# Patient Record
Sex: Male | Born: 1975 | Hispanic: Yes | Marital: Married | Smoking: Current every day smoker
Health system: Southern US, Community
[De-identification: ages and names within clinical notes are randomized; demographics above are authoritative.]

## PROBLEM LIST (undated history)

## (undated) DIAGNOSIS — J449 Chronic obstructive pulmonary disease, unspecified: Secondary | ICD-10-CM

## (undated) DIAGNOSIS — F17200 Nicotine dependence, unspecified, uncomplicated: Secondary | ICD-10-CM

## (undated) DIAGNOSIS — E119 Type 2 diabetes mellitus without complications: Secondary | ICD-10-CM

## (undated) DIAGNOSIS — K219 Gastro-esophageal reflux disease without esophagitis: Secondary | ICD-10-CM

## (undated) DIAGNOSIS — E785 Hyperlipidemia, unspecified: Secondary | ICD-10-CM

## (undated) HISTORY — DX: Nicotine dependence, unspecified, uncomplicated: F17.200

## (undated) HISTORY — DX: Gastro-esophageal reflux disease without esophagitis: K21.9

## (undated) HISTORY — DX: Chronic obstructive pulmonary disease, unspecified: J44.9

## (undated) HISTORY — DX: Type 2 diabetes mellitus without complications: E11.9

## (undated) HISTORY — DX: Hyperlipidemia, unspecified: E78.5

---

## 2014-11-23 ENCOUNTER — Ambulatory Visit (INDEPENDENT_AMBULATORY_CARE_PROVIDER_SITE_OTHER): Payer: Self-pay | Admitting: Family Medicine

## 2014-11-23 VITALS — BP 126/78 | HR 78 | Temp 98.3°F | Resp 16 | Ht 69.0 in | Wt 200.4 lb

## 2014-11-23 DIAGNOSIS — B029 Zoster without complications: Secondary | ICD-10-CM

## 2014-11-23 MED ORDER — HYDROCODONE-ACETAMINOPHEN 5-325 MG PO TABS: 1.0000 | ORAL_TABLET | Freq: Four times a day (QID) | ORAL | Status: DC | PRN

## 2014-11-23 NOTE — Progress Notes (Signed)
Patient ID: Matthew Hoover, male   DOB: 03-Apr-1976, 39 y.o.   MRN: [id number]   This chart was scribed for Elvina Sidle, MD by [facility], medical scribe at [facility].The patient was seen in exam room 03 and the patient's care was started at 11:07 AM.  Patient ID: Matthew Hoover MRN: [id number], DOB: 1975/07/12, 39 y.o. Date of Encounter: 11/23/2014  Primary Physician: No primary care provider on file.  Chief Complaint:  Chief Complaint  Patient presents with  . Back Pain    x 1 week  . Rash    x 3 days, located on back and going down left leg   HPI:  Matthew Hoover is a 39 y.o. male who presents to [facility] complaining of rash starting at his left lower back radiating down through his left buttock to he left upper leg. He initially developed a pain for two days then the rash appeared, which has been present for three days. When the pain first started he was unable to sleep but he has gradually been able to sleep.  History reviewed. No pertinent past medical history.   Home Meds: Prior to Admission medications   Not on File   Allergies: No Known Allergies  History   Social History  . Marital Status: Married    Spouse Name: N/A  . Number of Children: N/A  . Years of Education: N/A   Occupational History  . Not on file.   Social History Main Topics  . Smoking status: Current Every Day Smoker  . Smokeless tobacco: Not on file  . Alcohol Use: Not on file  . Drug Use: Not on file  . Sexual Activity: Not on file   Other Topics Concern  . Not on file   Social History Narrative  . No narrative on file   Review of Systems: Constitutional: negative for chills, fever, night sweats, weight changes, or fatigue  HEENT: negative for vision changes, hearing loss, congestion, rhinorrhea, ST, epistaxis, or sinus pressure Cardiovascular: negative for chest pain or palpitations Respiratory: negative for hemoptysis, wheezing,  shortness of breath, or cough Abdominal: negative for abdominal pain, nausea, vomiting, diarrhea, or constipation Dermatological:positive for rash Msk: positive for back pain Neurologic: negative for headache, dizziness, or syncope All other systems reviewed and are otherwise negative with the exception to those above and in the HPI.  Physical Exam: Blood pressure 126/78, pulse 78, temperature 98.3 F (36.8 C), temperature source Oral, resp. rate 16, height 5\' 9"  (1.753 m), weight 200 lb 6.4 oz (90.901 kg), SpO2 98 %., Body mass index is 29.58 kg/(m^2). General: Well developed, well nourished, in no acute distress. Head: Normocephalic, atraumatic, eyes without discharge, sclera non-icteric, nares are without discharge. Bilateral auditory canals clear, TM's are without perforation, pearly grey and translucent with reflective cone of light bilaterally. Oral cavity moist, posterior pharynx without exudate, erythema, peritonsillar abscess, or post nasal drip.  Neck: Supple. No thyromegaly. Full ROM. No lymphadenopathy. Lungs: Clear bilaterally to auscultation without wheezes, rales, or rhonchi. Breathing is unlabored. Heart: RRR with S1 S2. No murmurs, rubs, or gallops appreciated. Abdomen: Soft, non-tender, non-distended with normoactive bowel sounds. No hepatomegaly. No rebound/guarding. No obvious abdominal masses. Msk:  Strength and tone normal for age. Extremities/Skin: Warm and dry. No clubbing or cyanosis. No edema. Vascular rash starting at his left lower back that radiates down his left buttock to the lateral thigh. Neuro: Alert and oriented X 3. Moves all extremities spontaneousy. Gait is  normal. CNII-XII grossly in tact. Psych:  Responds to questions appropriately with a normal affect.    ASSESSMENT AND PLAN:  39 y.o. year old male with shingles  This chart was scribed in my presence and reviewed by me personally.    ICD-9-CM ICD-10-CM   1. Shingles 053.9 B02.9  HYDROcodone-acetaminophen (NORCO) 5-325 MG per tablet    Signed, Elvina Sidle, MD 11/23/2014 11:07 AM

## 2014-11-23 NOTE — Patient Instructions (Signed)
Culebrilla (Shingles) La culebrilla (herpes zoster) es una infeccin causada por el mismo virus que causa la varicela. La infeccin causa una erupcin y ampollas dolorosas llenas de lquido en la piel, las cuales se abren, forman costra y luego se curan. Puede ocurrir en cualquier parte del cuerpo, pero por lo general afecta solo un lado del cuerpo o del rostro. El dolor de la culebrilla suele durar alrededor de 1 mes. Sin embargo, algunas personas con culebrilla pueden sufrir dolor de larga duracin (crnico) en la zona del cuerpo afectada. La culebrilla suele ocurrir muchos aos despus de que la persona tuvo varicela. Es ms frecuente:  En personas mayores de 50 aos.  En los que tienen el sistema inmunolgico debilitado, como en los enfermos con VIH, sida o cncer.  En pacientes que toman medicamentos que debilitan el sistema inmunolgico, como los medicamentos que se indican en caso de transplantes.  En personas sometidas a un gran estrs. CAUSAS  La causa de la culebrilla es el virus varicela zoster (VZV), que tambin causa la varicela. Despus de que una persona se infecta con el virus, esta puede permanecer en su cuerpo durante aos en un estado inactivo (latente). Para causar la culebrilla, el virus se reactiva e irrumpe como una infeccin en una raz nerviosa. El virus se puede transmitir de persona a persona (es contagioso) a travs del contacto con las ampollas abiertas de la erupcin que causa la culebrilla. Solo se contagiar a las personas que no han padecido varicela. Cuando estas personas tienen exposicin al virus, pueden sufrir varicela. No van a desarrollar culebrilla. Una vez que las ampollas cicatrizan, la persona ya no contagia y no puede transmitir el virus a otras personas. SIGNOS Y SNTOMAS  La culebrilla aparece en etapas. Los sntomas iniciales pueden ser dolor, picazn y sensacin de hormigueo en un rea de la piel. Este dolor se describe generalmente como ardor,  punzante o pulstil. En unos pocos das o semanas aparece una erupcin roja dolorosa en el rea donde se sinti dolor, picazn y hormigueo. La erupcin generalmente aparece en un lado del cuerpo en un patrn de bandas o semejante a una correa. Luego la erupcin por lo general se convierte en un grupo de ampollas llenas de lquido. Estas ampollas forman una costra y se secan en aproximadamente 2 a 3 semanas. Con los sntomas iniciales, la erupcin o las ampollas tambin puede haber sntomas similares a la gripe. Estos pueden incluir:  Fiebre.  Escalofros.  Dolor de cabeza.  Malestar estomacal. DIAGNSTICO  El mdico le har un examen de la piel para diagnosticar la culebrilla. Tambin podrn tomarle muestras de piel o de fluido de las ampollas. Esta muestra se examina bajo el microscopio o se enva al laboratorio para su anlisis posterior. TRATAMIENTO  No existe un tratamiento especfico para la culebrilla. El mdico probablemente le recete medicamentos para ayudarlo a controlar el dolor, a recuperarse ms rpido y a evitar problemas a largo plazo. Puede incluir medicamentos antivirales, anti-inflamatorios y analgsicos. INSTRUCCIONES PARA EL CUIDADO EN EL HOGAR   Tome un bao de agua fra o aplique compresas fras en el rea de la erupcin o ampollas segn lo indicado. Esto disminuir el dolor y la picazn.  Tome los medicamentos solamente como se lo haya indicado el mdico.  Haga reposo segn las indicaciones del mdico.  Mantenga la erupcin y las ampollas limpias con jabn suave y agua fresca o como se lo indique el mdico.  No pellizque las ampollas ni se rasque en   la zona de la erupcin. Aplique una crema para calmar la picazn o cremas anestsicas en el rea afectada segn las indicaciones del mdico.  Mantenga la erupcin cubierta con un vendaje suelto (apsito).  Evite el contacto con:  Bebs.  Embarazadas.  Nios con eczema.  Personas mayores que han recibido un  transplante.  Personas con enfermedades crnicas, como leucemia y sida.  Use ropa holgada para ayudar a aliviar el dolor del roce con la erupcin.  Concurra a todas las visitas de control como se lo haya indicado el mdico. Si la zona afectada est en el rostro, podrn derivarlo a un especialista, como un mdico especialista en ojos (oftalmlogo) o en odos, nariz y garganta (otorrinolaringlogo). Concurrir a todas las visitas de control lo ayudar a evitar complicaciones en los ojos, dolor crnico y discapacidad. SOLICITE ATENCIN MDICA DE INMEDIATO SI:   Tiene dolor en el rostro, en la zona de los ojos o tiene prdida de sensibilidad en un lado del rostro.  Siente dolor o zumbido en el odo.  Tiene prdida del gusto.  El dolor no se alivia con los medicamentos prescritos.  La irritacin o la hinchazn se extienden.  Tiene ms dolor e inflamacin.  La afeccin empeora o ha cambiado.  Tiene fiebre. ASEGRESE DE QUE:  Comprende estas instrucciones.  Controlar su afeccin.  Recibir ayuda de inmediato si no mejora o si empeora. Document Released: 02/24/2005 Document Revised: 10/01/2013 Exit[employer] Patient Information 2015 [employer]. This information is not intended to replace advice given to you by your health care provider. Make sure you discuss any questions you have with your health care provider.  

## 2015-05-13 ENCOUNTER — Ambulatory Visit (INDEPENDENT_AMBULATORY_CARE_PROVIDER_SITE_OTHER): Payer: Self-pay | Admitting: Emergency Medicine

## 2015-05-13 VITALS — BP 122/80 | HR 74 | Temp 97.5°F | Resp 16 | Ht 69.0 in | Wt 203.0 lb

## 2015-05-13 DIAGNOSIS — J209 Acute bronchitis, unspecified: Secondary | ICD-10-CM

## 2015-05-13 MED ORDER — PROMETHAZINE-CODEINE 6.25-10 MG/5ML PO SYRP: 5.0000 mL | ORAL_SOLUTION | Freq: Four times a day (QID) | ORAL | Status: DC | PRN

## 2015-05-13 MED ORDER — AZITHROMYCIN 250 MG PO TABS: ORAL_TABLET | ORAL | Status: DC

## 2015-05-13 NOTE — Patient Instructions (Signed)

## 2015-05-13 NOTE — Progress Notes (Signed)
Subjective:  Patient ID: Matthew Hoover, male    DOB: 07-14-1975  Age: 39 y.o. MRN: [id number]  CC: Back Pain and Cough   HPI Matthew Hoover presents  patient has a cough productive of purulent sputum. Has been sick for a little over week. He has no fever chills. No wheezing or shortness of breath. No nausea vomiting or stool change. He has no rash. Has no improvement with over-the-counter medication. Contact with TB.  History Matthew Hoover has no past medical history on file.   He has no past surgical history on file.   His  family history is not on file.  He   reports that he has been smoking.  He has never used smokeless tobacco. He reports that he does not drink alcohol or use illicit drugs.  Outpatient Prescriptions Prior to Visit  Medication Sig Dispense Refill  . HYDROcodone-acetaminophen (NORCO) 5-325 MG per tablet Take 1 tablet by mouth every 6 (six) hours as needed for moderate pain. (Patient not taking: Reported on 05/13/2015) 12 tablet 0   No facility-administered medications prior to visit.    Social History   Social History  . Marital Status: Married    Spouse Name: N/A  . Number of Children: N/A  . Years of Education: N/A   Social History Main Topics  . Smoking status: Current Every Day Smoker  . Smokeless tobacco: Never Used  . Alcohol Use: No  . Drug Use: No  . Sexual Activity: Not Asked   Other Topics Concern  . None   Social History Narrative     Review of Systems  Constitutional: Negative for fever, chills and appetite change.  HENT: Negative for congestion, ear pain, postnasal drip, sinus pressure and sore throat.   Eyes: Negative for pain and redness.  Respiratory: Positive for cough. Negative for shortness of breath and wheezing.   Cardiovascular: Negative for leg swelling.  Gastrointestinal: Negative for nausea, vomiting, abdominal pain, diarrhea, constipation and blood in stool.  Endocrine: Negative for polyuria.  Genitourinary: Negative  for dysuria, urgency, frequency and flank pain.  Musculoskeletal: Negative for gait problem.  Skin: Negative for rash.  Neurological: Negative for weakness and headaches.  Psychiatric/Behavioral: Negative for confusion and decreased concentration. The patient is not nervous/anxious.     Objective:  BP 122/80 mmHg  Pulse 74  Temp(Src) 97.5 F (36.4 C) (Oral)  Resp 16  Ht  (1.753 m)  Wt 203 lb (92.08 kg)  BMI 29.96 kg/m2  SpO2 98%  Physical Exam  Constitutional: He is oriented to person, place, and time. He appears well-developed and well-nourished. No distress.  HENT:  Head: Normocephalic and atraumatic.  Right Ear: External ear normal.  Left Ear: External ear normal.  Nose: Nose normal.  Eyes: Conjunctivae and EOM are normal. Pupils are equal, round, and reactive to light. No scleral icterus.  Neck: Normal range of motion. Neck supple. No tracheal deviation present.  Cardiovascular: Normal rate, regular rhythm and normal heart sounds.   Pulmonary/Chest: Effort normal. No respiratory distress. He has no wheezes. He has no rales.  Abdominal: He exhibits no mass. There is no tenderness. There is no rebound and no guarding.  Musculoskeletal: He exhibits no edema.  Lymphadenopathy:    He has no cervical adenopathy.  Neurological: He is alert and oriented to person, place, and time. Coordination normal.  Skin: Skin is warm and dry. No rash noted.  Psychiatric: He has a normal mood and affect. His behavior is normal.  Assessment & Plan:   Matthew MageJuan was seen today for back pain and cough.  Diagnoses and all orders for this visit:  Acute bronchitis, unspecified organism  Other orders -     azithromycin (ZITHROMAX) 250 MG tablet; Take 2 tabs PO x 1 dose, then 1 tab PO QD x 4 days -     promethazine-codeine (PHENERGAN WITH CODEINE) 6.25-10 MG/5ML syrup; Take 5-10 mLs by mouth every 6 (six) hours as needed.  I am having [name] start on azithromycin and  promethazine-codeine. I am also having him maintain his HYDROcodone-acetaminophen.  Meds ordered this encounter  Medications  . azithromycin (ZITHROMAX) 250 MG tablet    Sig: Take 2 tabs PO x 1 dose, then 1 tab PO QD x 4 days    Dispense:  6 tablet    Refill:  0  . promethazine-codeine (PHENERGAN WITH CODEINE) 6.25-10 MG/5ML syrup    Sig: Take 5-10 mLs by mouth every 6 (six) hours as needed.    Dispense:  120 mL    Refill:  0    Appropriate red flag conditions were discussed with the patient as well as actions that should be taken.  Patient expressed his understanding.  Follow-up: Return if symptoms worsen or fail to improve.  [name], [name], MD

## 2016-05-15 ENCOUNTER — Encounter (HOSPITAL_COMMUNITY): Payer: Self-pay | Admitting: *Deleted

## 2016-05-15 ENCOUNTER — Ambulatory Visit (HOSPITAL_COMMUNITY)
Admission: EM | Admit: 2016-05-15 | Discharge: 2016-05-15 | Disposition: A | Discharge status: Home / Self Care | Payer: Self-pay | Attending: Family Medicine | Admitting: Family Medicine

## 2016-05-15 ENCOUNTER — Ambulatory Visit (INDEPENDENT_AMBULATORY_CARE_PROVIDER_SITE_OTHER): Payer: Self-pay

## 2016-05-15 DIAGNOSIS — K219 Gastro-esophageal reflux disease without esophagitis: Secondary | ICD-10-CM | POA: Insufficient documentation

## 2016-05-15 DIAGNOSIS — K2100 Gastro-esophageal reflux disease with esophagitis, without bleeding: Secondary | ICD-10-CM

## 2016-05-15 DIAGNOSIS — R059 Cough, unspecified: Secondary | ICD-10-CM

## 2016-05-15 DIAGNOSIS — J029 Acute pharyngitis, unspecified: Secondary | ICD-10-CM | POA: Insufficient documentation

## 2016-05-15 DIAGNOSIS — J40 Bronchitis, not specified as acute or chronic: Secondary | ICD-10-CM

## 2016-05-15 DIAGNOSIS — R05 Cough: Secondary | ICD-10-CM | POA: Insufficient documentation

## 2016-05-15 DIAGNOSIS — Z79899 Other long term (current) drug therapy: Secondary | ICD-10-CM | POA: Insufficient documentation

## 2016-05-15 DIAGNOSIS — F17200 Nicotine dependence, unspecified, uncomplicated: Secondary | ICD-10-CM | POA: Insufficient documentation

## 2016-05-15 LAB — POCT RAPID STREP A: STREPTOCOCCUS, GROUP A SCREEN (DIRECT): NEGATIVE

## 2016-05-15 MED ORDER — ALBUTEROL SULFATE HFA 108 (90 BASE) MCG/ACT IN AERS: 2.0000 | INHALATION_SPRAY | RESPIRATORY_TRACT | 0 refills | Status: DC | PRN

## 2016-05-15 MED ORDER — BENZONATATE 100 MG PO CAPS: 200.0000 mg | ORAL_CAPSULE | Freq: Three times a day (TID) | ORAL | 0 refills | Status: DC | PRN

## 2016-05-15 MED ORDER — METHYLPREDNISOLONE 4 MG PO TBPK: ORAL_TABLET | ORAL | 0 refills | Status: DC

## 2016-05-15 MED ORDER — OMEPRAZOLE 20 MG PO CPDR: 20.0000 mg | DELAYED_RELEASE_CAPSULE | Freq: Every day | ORAL | 0 refills | Status: DC

## 2016-05-15 MED ORDER — AZITHROMYCIN 250 MG PO TABS: 250.0000 mg | ORAL_TABLET | Freq: Every day | ORAL | 0 refills | Status: DC

## 2016-05-15 NOTE — ED Triage Notes (Signed)
C/O cough x approx 1 month with intermittent hoarse voice.  Also c/o sensation of something caught in throat x approx 1 month. Initially had fevers, but now resolved.  [place] in [place] 8 days ago - was given 3 Rxs (one abx) - unk [place] name.  States no better.

## 2016-05-15 NOTE — ED Provider Notes (Signed)
CSN: [id number]     Arrival date & time 05/15/16  1502 History   First MD Initiated Contact with Patient 05/15/16 1700     Chief Complaint  Patient presents with  . Cough  . Sore Throat   (Consider location/radiation/quality/duration/timing/severity/associated sxs/prior Treatment) Patient c/o persistent cough x 1 month.  He is a smoker and his wife states she is worried he may have cancer from smoking.  He was treated by a friend who is a doctor in [place] with Levaquin and indomethacin.  He states he felt better for about a week then the sx's or cough returned.  He admits to having some GERD sx's.   The history is provided by the patient.  Cough  Cough characteristics:  Productive Sputum characteristics:  Clear Severity:  Moderate Onset quality:  Gradual Duration:  4 weeks Timing:  Intermittent Progression:  Waxing and waning Chronicity:  Recurrent Smoker: yes   Context: smoke exposure and upper respiratory infection   Relieved by:  Nothing Worsened by:  Nothing Ineffective treatments:  None tried Sore Throat     History reviewed. No pertinent past medical history. History reviewed. No pertinent surgical history. No family history on file. Social History  Substance Use Topics  . Smoking status: Former Smoker    Quit date: 04/15/2016  . Smokeless tobacco: Never Used  . Alcohol use No    Review of Systems  Constitutional: Negative.   HENT: Negative.   Eyes: Negative.   Respiratory: Positive for cough.   Cardiovascular: Negative.   Gastrointestinal: Negative.   Endocrine: Negative.   Genitourinary: Negative.   Musculoskeletal: Negative.   Skin: Negative.   Allergic/Immunologic: Negative.   Neurological: Negative.   Hematological: Negative.   Psychiatric/Behavioral: Negative.     Allergies  Patient has no known allergies.  Home Medications   Prior to Admission medications   Medication Sig Start Date End Date Taking? Authorizing Provider  azithromycin  (ZITHROMAX) 250 MG tablet Take 2 tabs PO x 1 dose, then 1 tab PO QD x 4 days 05/13/15   Carmelina DaneJeffery S Anderson, MD  HYDROcodone-acetaminophen (NORCO) 5-325 MG per tablet Take 1 tablet by mouth every 6 (six) hours as needed for moderate pain. Patient not taking: Reported on 05/13/2015 11/23/14   Elvina SidleKurt Lauenstein, MD  promethazine-codeine [facility](PHENERGAN WITH CODEINE) 6.25-10 MG/5ML syrup Take 5-10 mLs by mouth every 6 (six) hours as needed. 05/13/15   Carmelina DaneJeffery S Anderson, MD   Meds Ordered and Administered this Visit  Medications - No data to display  BP 124/70   Pulse 79   Temp 98.4 F (36.9 C) (Oral)   Resp 16   SpO2 97%  No data found.   Physical Exam  Constitutional: He is oriented to person, place, and time. He appears well-developed and well-nourished.  HENT:  Head: Normocephalic and atraumatic.  Right Ear: External ear normal.  Left Ear: External ear normal.  Mouth/Throat: Oropharynx is clear and moist.  Eyes: Conjunctivae and EOM are normal. Pupils are equal, round, and reactive to light.  Neck: Normal range of motion. Neck supple.  Cardiovascular: Normal rate, regular rhythm and normal heart sounds.   Pulmonary/Chest: Effort normal and breath sounds normal.  Abdominal: Soft. Bowel sounds are normal.  Musculoskeletal: Normal range of motion.  Neurological: He is alert and oriented to person, place, and time.  Nursing note and vitals reviewed.   Urgent Care Course   Clinical Course     Procedures (including critical care time)  Labs Review Labs Reviewed  POCT RAPID STREP A    Imaging Review No results found.   Visual Acuity Review  Right Eye Distance:   Left Eye Distance:   Bilateral Distance:    Right Eye Near:   Left Eye Near:    Bilateral Near:         MDM   1. Bronchitis   2. Smoking   3. Cough 4. GERD  Push po fluids, rest, tylenol and motrin otc prn as directed for fever, arthralgias, and myalgias.  Follow up prn if sx's continue or persist.     Deatra CanterWilliam J Oxford, FNP 05/15/16 [phone]

## 2016-05-20 LAB — CULTURE, GROUP A STREP (THRC)

## 2016-10-02 ENCOUNTER — Encounter (HOSPITAL_COMMUNITY): Payer: Self-pay | Admitting: *Deleted

## 2016-10-02 ENCOUNTER — Ambulatory Visit (HOSPITAL_COMMUNITY)
Admission: EM | Admit: 2016-10-02 | Discharge: 2016-10-02 | Disposition: A | Discharge status: Home / Self Care | Payer: Self-pay | Attending: Internal Medicine | Admitting: Internal Medicine

## 2016-10-02 DIAGNOSIS — J029 Acute pharyngitis, unspecified: Secondary | ICD-10-CM

## 2016-10-02 MED ORDER — FLUTICASONE PROPIONATE 50 MCG/ACT NA SUSP: 1.0000 | Freq: Every day | NASAL | 2 refills | Status: DC

## 2016-10-02 MED ORDER — DEXAMETHASONE SODIUM PHOSPHATE 10 MG/ML IJ SOLN
10.0000 mg | Freq: Once | INTRAMUSCULAR | Status: AC
  Administered 2016-10-02: 10 mg via INTRAMUSCULAR

## 2016-10-02 MED ORDER — DEXAMETHASONE SODIUM PHOSPHATE 10 MG/ML IJ SOLN
INTRAMUSCULAR | Status: AC
  Filled 2016-10-02: qty 1

## 2016-10-02 NOTE — ED Provider Notes (Signed)
CSN: [id number]     Arrival date & time 10/02/16  1905 History   First MD Initiated Contact with Patient 10/02/16 2006     Chief Complaint  Patient presents with  . Sore Throat   (Consider location/radiation/quality/duration/timing/severity/associated sxs/prior Treatment) 41 y.o. male presents with sore throat, cngestion and enlarged cervical lymph nodes X 2 days. Wife and patient report different symtoms with patient stating that he has not had cough and wife stating that the patient has cough and "rattling in his throat" Patient denies any fevers. Wife is concerned that patient has lung cancer or stomach cancer from GERD. Patient is a smoker. Condition is acute in nature. Condition is made better by nothing. Condition is made worse by nothing. Patient denies any treatment prior to there arrival at this facility. Patient education given regarding smoking cessation.        History reviewed. No pertinent past medical history. History reviewed. No pertinent surgical history. No family history on file. Social History  Substance Use Topics  . Smoking status: Current Every Day Smoker    Last attempt to quit: 04/15/2016  . Smokeless tobacco: Never Used  . Alcohol use No    Review of Systems  Constitutional: Negative for chills and fever.  HENT: Positive for sore throat. Negative for ear pain.   Eyes: Negative for pain and visual disturbance.  Respiratory: Negative for cough and shortness of breath.   Cardiovascular: Negative for chest pain and palpitations.  Gastrointestinal: Negative for abdominal pain and vomiting.  Genitourinary: Negative for dysuria and hematuria.  Musculoskeletal: Negative for arthralgias and back pain.  Skin: Negative for color change and rash.  Neurological: Negative for seizures and syncope.  All other systems reviewed and are negative.   Allergies  Patient has no known allergies.  Home Medications   Prior to Admission medications   Medication Sig Start  Date End Date Taking? Authorizing Provider  omeprazole (PRILOSEC) 20 MG capsule Take 1 capsule (20 mg total) by mouth daily. Patient taking differently: Take 20 mg by mouth daily. Pt only taking occasionally 05/15/16  Yes Oxford, Anselm Pancoast, FNP  albuterol (PROVENTIL HFA;VENTOLIN HFA) 108 (90 Base) MCG/ACT inhaler Inhale 2 puffs into the lungs every 4 (four) hours as needed for wheezing or shortness of breath. 05/15/16   Deatra Canter, FNP  azithromycin (ZITHROMAX) 250 MG tablet Take 2 tabs PO x 1 dose, then 1 tab PO QD x 4 days 05/13/15   Carmelina Dane, MD  azithromycin (ZITHROMAX) 250 MG tablet Take 1 tablet (250 mg total) by mouth daily. Take first 2 tablets together, then 1 every day until finished. 05/15/16   Deatra Canter, FNP  benzonatate (TESSALON) 100 MG capsule Take 2 capsules (200 mg total) by mouth 3 (three) times daily as needed for cough. 05/15/16   Deatra Canter, FNP  fluticasone (FLONASE) 50 MCG/ACT nasal spray Place 1 spray into both nostrils daily. 10/02/16   Alene Mires, NP  HYDROcodone-acetaminophen (NORCO) 5-325 MG per tablet Take 1 tablet by mouth every 6 (six) hours as needed for moderate pain. Patient not taking: Reported on 05/13/2015 11/23/14   Elvina Sidle, MD  methylPREDNISolone (MEDROL DOSEPAK) 4 MG TBPK tablet Take 6-5-4-3-2-1 po qd 05/15/16   Deatra Canter, FNP  promethazine-codeine (PHENERGAN WITH CODEINE) 6.25-10 MG/5ML syrup Take 5-10 mLs by mouth every 6 (six) hours as needed. 05/13/15   Carmelina Dane, MD   Meds Ordered and Administered this Visit   Medications  dexamethasone (DECADRON)  injection 10 mg (10 mg Intramuscular Given 10/02/16 2035)    BP 118/79   Pulse 67   Temp 97.7 F (36.5 C) (Oral)   Resp 16   SpO2 97%  No data found.   Physical Exam  Constitutional: He appears well-developed and well-nourished.  HENT:  Head: Normocephalic and atraumatic.  enlarged tonsil and enlarged lymph nodes to anterior  cervical chain.   Eyes: Conjunctivae are normal.  Neck: Neck supple.  Cardiovascular: Normal rate and regular rhythm.   No murmur heard. Pulmonary/Chest: Effort normal and breath sounds normal. No respiratory distress.  Abdominal: Soft. There is no tenderness.  Musculoskeletal: He exhibits no edema.  Neurological: He is alert.  Skin: Skin is warm and dry.  Psychiatric: He has a normal mood and affect.  Nursing note and vitals reviewed.   Urgent Care Course     Procedures (including critical care time)  Labs Review Labs Reviewed - No data to display  Imaging Review No results found.       MDM   1. Sore throat       Alene MiresOmohundro, Junita Kubota C, NP 10/02/16 2106

## 2016-10-02 NOTE — ED Triage Notes (Signed)
C/O slight sore throat and hoarse voice [facility]  C/O spitting up scant amounts at night.

## 2017-06-23 IMAGING — DX DG CHEST 2V
2 series · 2 of 2 positions shown · non-contrast
Comparison: None.

CLINICAL DATA: Initial evaluation for persistent cough for 1 month.
History of smoking.

EXAM:
CHEST  2 VIEW

[chest pa]
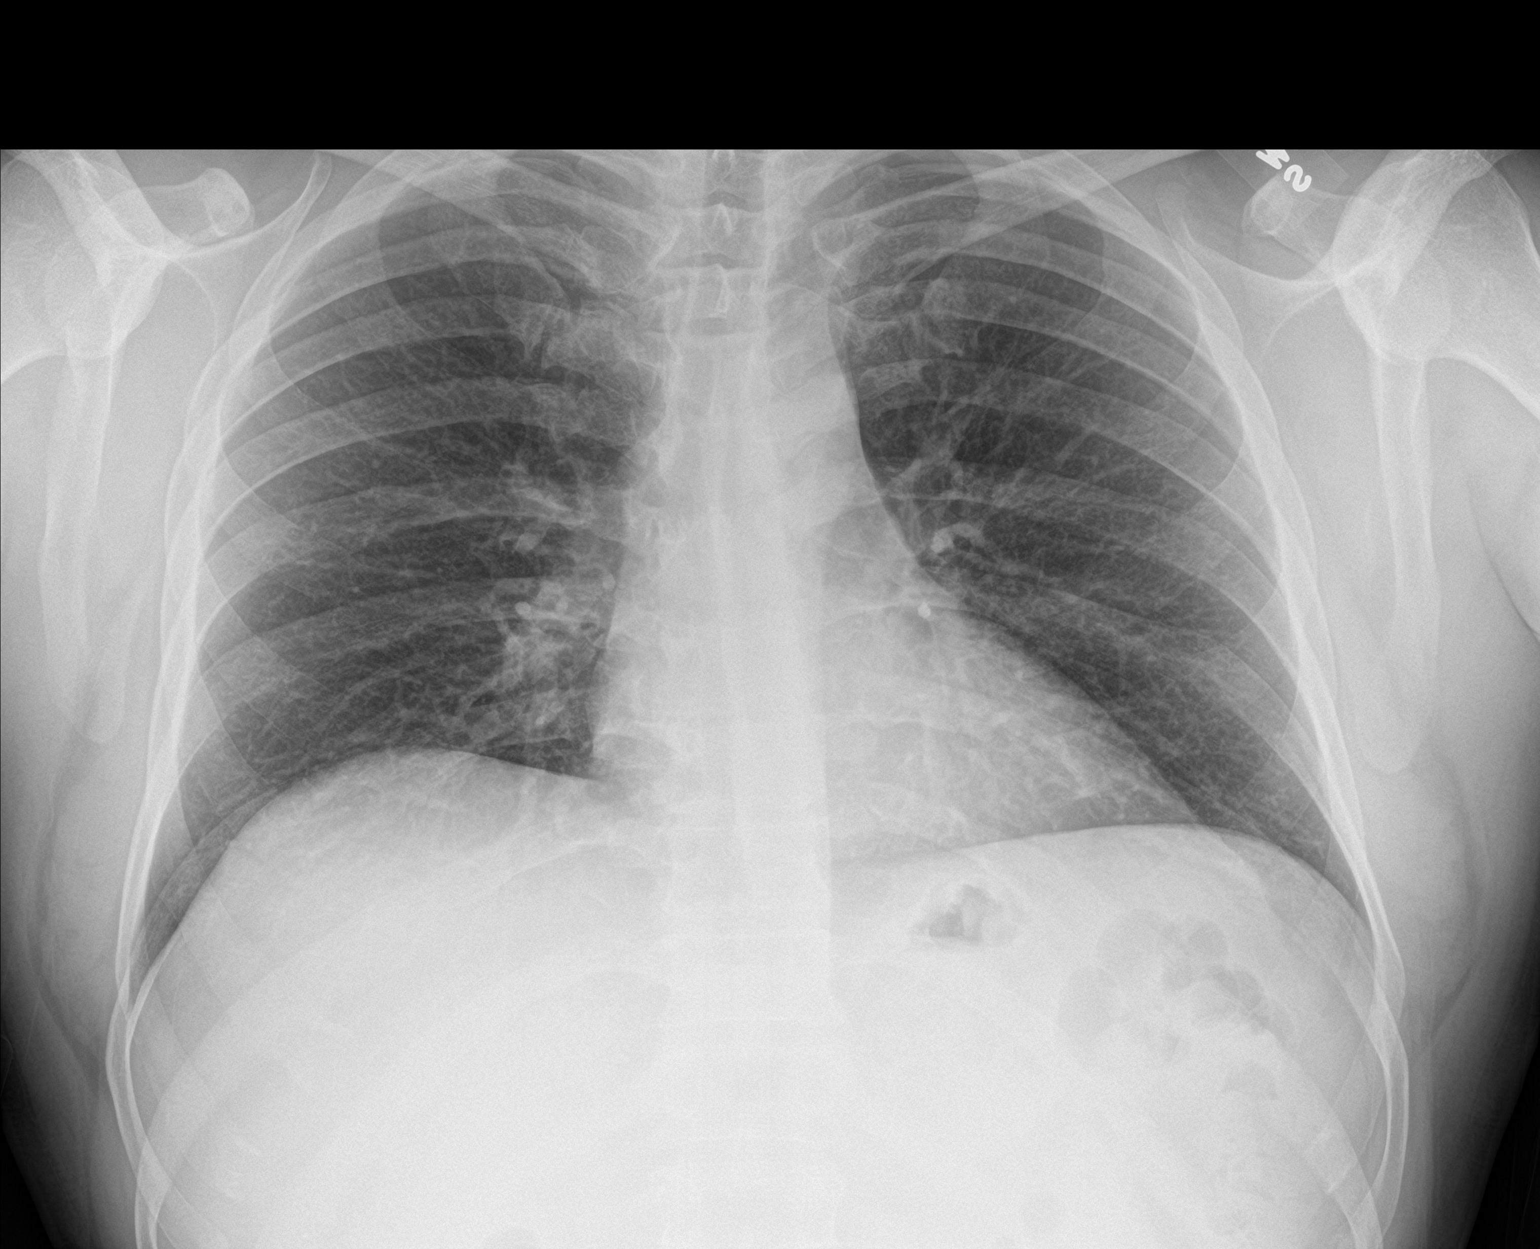

[chest lat]
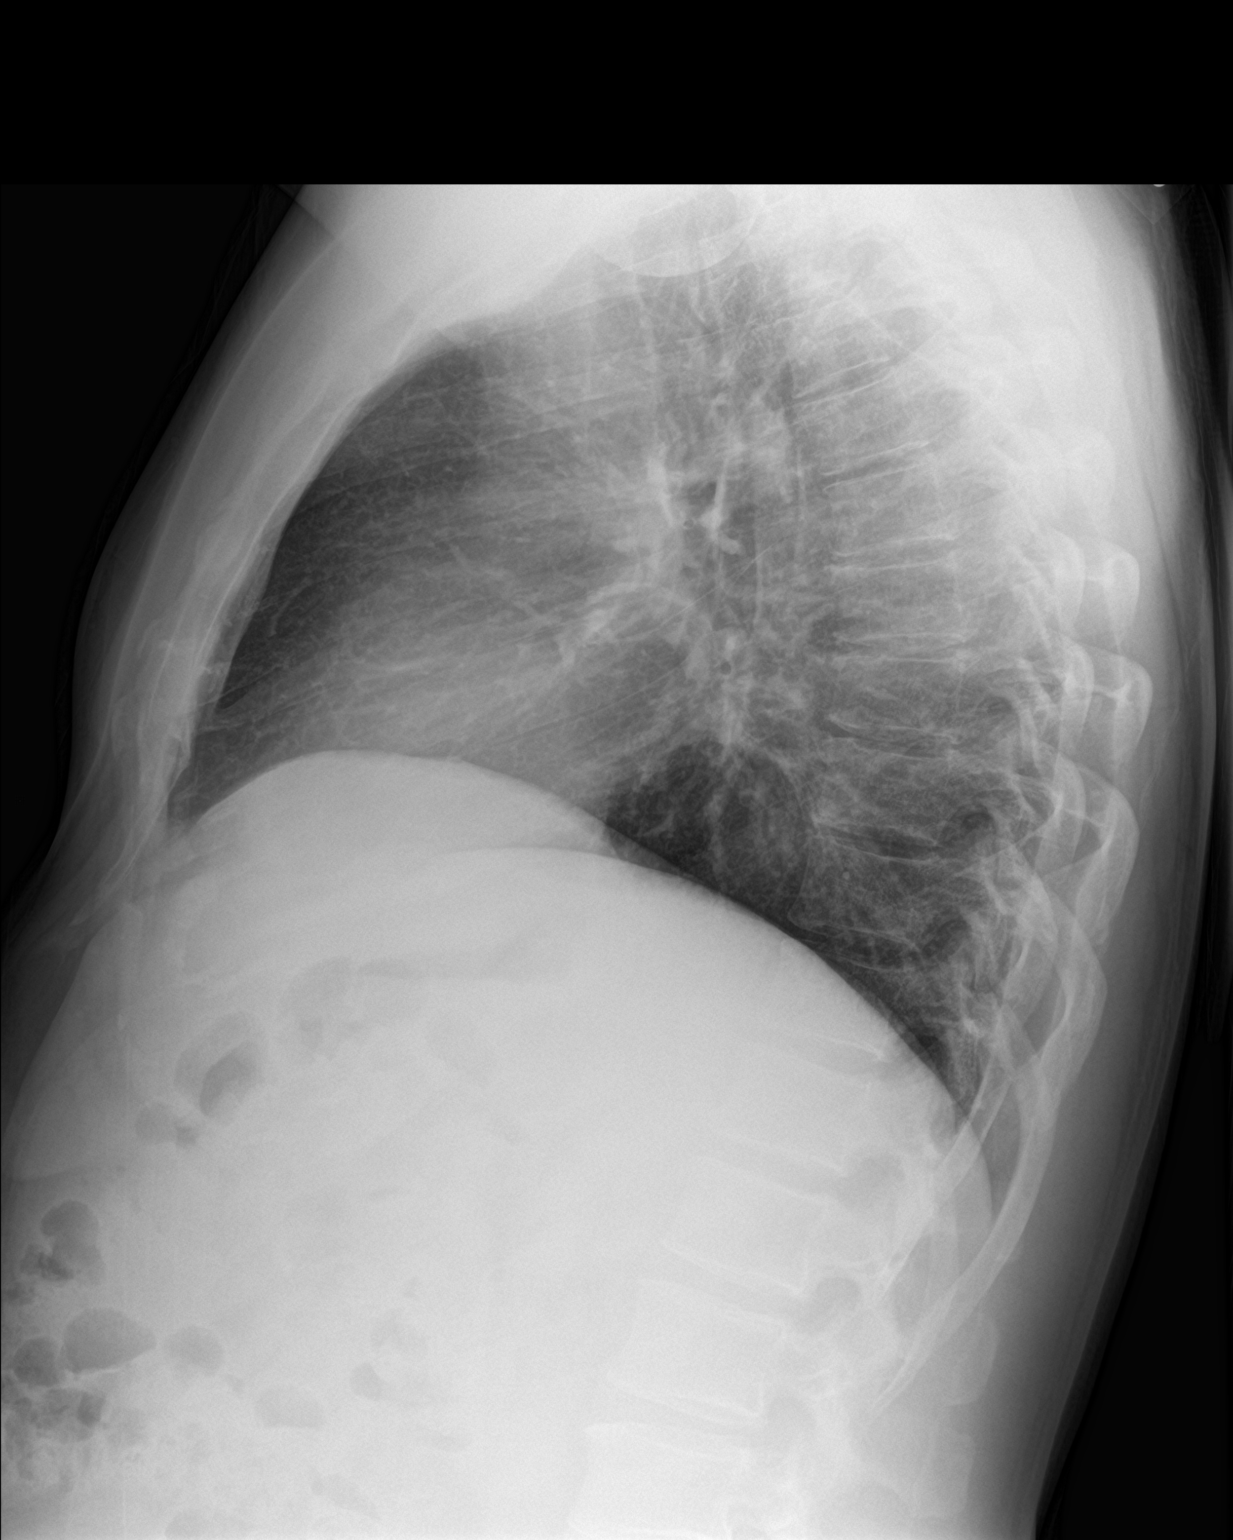

[2 of 2 positions shown; findings below may reference images not displayed]

FINDINGS: The heart size and mediastinal contours are within normal limits.
Both lungs are clear. The visualized skeletal structures are
unremarkable.
IMPRESSION: No radiographic evidence for active cardiopulmonary disease.

## 2018-12-16 ENCOUNTER — Encounter (HOSPITAL_COMMUNITY): Payer: Self-pay

## 2018-12-16 ENCOUNTER — Other Ambulatory Visit: Payer: Self-pay

## 2018-12-16 ENCOUNTER — Emergency Department (HOSPITAL_COMMUNITY)
Admission: EM | Admit: 2018-12-16 | Discharge: 2018-12-16 | Disposition: A | Discharge status: Home / Self Care | Payer: Self-pay | Attending: Emergency Medicine | Admitting: Emergency Medicine

## 2018-12-16 DIAGNOSIS — S60812A Abrasion of left wrist, initial encounter: Secondary | ICD-10-CM | POA: Insufficient documentation

## 2018-12-16 DIAGNOSIS — F17210 Nicotine dependence, cigarettes, uncomplicated: Secondary | ICD-10-CM | POA: Insufficient documentation

## 2018-12-16 DIAGNOSIS — Y92414 Local residential or business street as the place of occurrence of the external cause: Secondary | ICD-10-CM | POA: Insufficient documentation

## 2018-12-16 DIAGNOSIS — S30810A Abrasion of lower back and pelvis, initial encounter: Secondary | ICD-10-CM | POA: Insufficient documentation

## 2018-12-16 DIAGNOSIS — Y93I9 Activity, other involving external motion: Secondary | ICD-10-CM | POA: Insufficient documentation

## 2018-12-16 DIAGNOSIS — Y999 Unspecified external cause status: Secondary | ICD-10-CM | POA: Insufficient documentation

## 2018-12-16 MED ORDER — CYCLOBENZAPRINE HCL 10 MG PO TABS: 10.0000 mg | ORAL_TABLET | Freq: Two times a day (BID) | ORAL | 0 refills | Status: DC | PRN

## 2018-12-16 MED ORDER — IBUPROFEN 600 MG PO TABS: 600.0000 mg | ORAL_TABLET | Freq: Four times a day (QID) | ORAL | 0 refills | Status: DC | PRN
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
## 2019-10-20 ENCOUNTER — Ambulatory Visit: Payer: Self-pay | Attending: Internal Medicine

## 2019-10-20 DIAGNOSIS — Z23 Encounter for immunization: Secondary | ICD-10-CM

## 2019-10-20 NOTE — Progress Notes (Signed)
   Covid-19 Vaccination Clinic  Name:  Matthew Hoover    MRN: [id number] DOB: [birth year]  10/20/2019  Matthew Hoover was observed post Covid-19 immunization for 15 minutes without incident. He was provided with Vaccine Information Sheet and instruction to access the V-Safe system.   Matthew Hoover was instructed to call 911 with any severe reactions post vaccine: Marland Kitchen Difficulty breathing  . Swelling of face and throat  . A fast heartbeat  . A bad rash all over body  . Dizziness and weakness   Immunizations Administered    Name Date Dose VIS Date Route   Pfizer COVID-19 Vaccine 10/20/2019  8:33 AM 0.3 mL 07/25/2018 Intramuscular   Manufacturer: [employer], [employer]   Lot: [id number]   NDC: [address]-4

## 2019-11-12 ENCOUNTER — Ambulatory Visit: Payer: Self-pay | Attending: Internal Medicine

## 2019-11-12 DIAGNOSIS — Z23 Encounter for immunization: Secondary | ICD-10-CM

## 2019-11-12 NOTE — Progress Notes (Signed)
   Covid-19 Vaccination Clinic  Name:  Matthew Hoover    MRN: [id number] DOB: 05/21/1976  11/12/2019  Matthew Hoover was observed post Covid-19 immunization for 15 minutes without incident. He was provided with Vaccine Information Sheet and instruction to access the V-Safe system.   Matthew Hoover was instructed to call 911 with any severe reactions post vaccine: Marland Kitchen Difficulty breathing  . Swelling of face and throat  . A fast heartbeat  . A bad rash all over body  . Dizziness and weakness   Immunizations Administered    Name Date Dose VIS Date Route   Pfizer COVID-19 Vaccine 11/12/2019  9:10 AM 0.3 mL 07/25/2018 Intramuscular   Manufacturer: [employer], [employer]   Lot: [id number]   NDC: [address]-4

## 2021-08-02 ENCOUNTER — Encounter (HOSPITAL_COMMUNITY): Payer: Self-pay | Admitting: *Deleted

## 2021-08-02 ENCOUNTER — Ambulatory Visit (HOSPITAL_COMMUNITY)
Admission: EM | Admit: 2021-08-02 | Discharge: 2021-08-02 | Disposition: A | Discharge status: Home / Self Care | Payer: Self-pay | Attending: Emergency Medicine | Admitting: Emergency Medicine

## 2021-08-02 ENCOUNTER — Other Ambulatory Visit: Payer: Self-pay

## 2021-08-02 DIAGNOSIS — S91332A Puncture wound without foreign body, left foot, initial encounter: Secondary | ICD-10-CM

## 2021-08-02 DIAGNOSIS — Z23 Encounter for immunization: Secondary | ICD-10-CM

## 2021-08-02 MED ORDER — ACETAMINOPHEN 325 MG PO TABS
ORAL_TABLET | ORAL | Status: AC
  Filled 2021-08-02: qty 2

## 2021-08-02 MED ORDER — TETANUS-DIPHTH-ACELL PERTUSSIS 5-2.5-18.5 LF-MCG/0.5 IM SUSY
0.5000 mL | PREFILLED_SYRINGE | Freq: Once | INTRAMUSCULAR | Status: AC
  Administered 2021-08-02: 0.5 mL via INTRAMUSCULAR

## 2021-08-02 MED ORDER — LEVOFLOXACIN 750 MG PO TABS: 750.0000 mg | ORAL_TABLET | Freq: Every day | ORAL | 0 refills | Status: AC

## 2021-08-02 MED ORDER — TETANUS-DIPHTH-ACELL PERTUSSIS 5-2.5-18.5 LF-MCG/0.5 IM SUSY
PREFILLED_SYRINGE | INTRAMUSCULAR | Status: AC
  Filled 2021-08-02: qty 0.5

## 2021-08-02 MED ORDER — ACETAMINOPHEN 325 MG PO TABS
975.0000 mg | ORAL_TABLET | Freq: Once | ORAL | Status: AC
  Administered 2021-08-02: 975 mg via ORAL

## 2021-08-02 NOTE — Discharge Instructions (Addendum)
Please see the enclosed information about your vaccine today and puncture wound care.  Believe that your wound at this time does not appear to be infected and that you would benefit from 5 days of antibiotic therapy.  The tablet that is recommended is levofloxacin and is actually only taken once a day, not twice a day.  You will receive 5 tablets, please take 1 tablet today and then 1 tablet every day thereafter until complete. ? ?Please monitor your wound for signs of increasing infection, if you feel that the wound is getting worse instead of getting better, please go to the emergency room, you may require IV antibiotic therapy. ? ?Thank you for visiting urgent care today. ?

## 2021-08-02 NOTE — ED Triage Notes (Signed)
Pt reports he stepped on a nail with Lt foot on Friday. Pt reports he needs a tetanus shot. ?

## 2021-08-02 NOTE — ED Provider Notes (Signed)
[facility]    CSN: [id number] Arrival date & time: 08/02/21  1041    HISTORY   Chief Complaint  Patient presents with   Foot Injury   HPI Matthew Hoover is a 46 y.o. male. Patient is here with his daughter today who provides interpretation for him.  Patient states that he stepped on a roofing nail yesterday that was approximately an inch long.  Patient states the nail went through his entire shoe and about 1/4 inch of the nail went into the ball of his left foot.  Patient states the puncture wound is a little tender at this time, but denies drainage from the wound.  Patient states he is certain that the entire nail came out of his foot.  Patient states he does not recall the last time he had tetanus shot but is certain that its been more than 10 years.  Patient denies fever, myalgia, arthralgia, chills, difficulty walking at this time.  Vital signs are normal on arrival today.  The history is provided by the patient and a relative. A language interpreter was used (Daughter).  History reviewed. No pertinent past medical history. There are no problems to display for this patient.  History reviewed. No pertinent surgical history.  Home Medications    Prior to Admission medications   Not on File    Family History History reviewed. No pertinent family history. Social History Social History   Tobacco Use   Smoking status: Every Day    Types: Cigarettes    Last attempt to quit: 04/15/2016    Years since quitting: 5.3   Smokeless tobacco: Never  Substance Use Topics   Alcohol use: No    Alcohol/week: 0.0 standard drinks   Drug use: No   Allergies   Patient has no known allergies.  Review of Systems Review of Systems Pertinent findings noted in history of present illness.   Physical Exam Triage Vital Signs ED Triage Vitals  Enc Vitals Group     BP 03/27/21 0827 (!) 147/82     Pulse Rate 03/27/21 0827 72     Resp 03/27/21 0827 18     Temp 03/27/21 0827  98.3 F (36.8 C)     Temp Source 03/27/21 0827 Oral     SpO2 03/27/21 0827 98 %     Weight --      Height --      Head Circumference --      Peak Flow --      Pain Score 03/27/21 0826 5     Pain Loc --      Pain Edu? --      Excl. in GC? --   No data found.  Updated Vital Signs BP 131/89    Pulse 81    Temp 98.4 F (36.9 C)    Resp 18    SpO2 98%   Physical Exam Vitals and nursing note reviewed.  Constitutional:      General: He is not in acute distress.    Appearance: Normal appearance. He is not ill-appearing.  HENT:     Head: Normocephalic and atraumatic.  Eyes:     General: Lids are normal.        Right eye: No discharge.        Left eye: No discharge.     Extraocular Movements: Extraocular movements intact.     Conjunctiva/sclera: Conjunctivae normal.     Right eye: Right conjunctiva is not injected.     Left eye:  Left conjunctiva is not injected.  Neck:     Trachea: Trachea and phonation normal.  Cardiovascular:     Rate and Rhythm: Normal rate and regular rhythm.     Pulses: Normal pulses.     Heart sounds: Normal heart sounds. No murmur heard.   No friction rub. No gallop.  Pulmonary:     Effort: Pulmonary effort is normal. No accessory muscle usage, prolonged expiration or respiratory distress.     Breath sounds: Normal breath sounds. No stridor, decreased air movement or transmitted upper airway sounds. No decreased breath sounds, wheezing, rhonchi or rales.  Chest:     Chest wall: No tenderness.  Musculoskeletal:        General: Normal range of motion.     Cervical back: Normal range of motion and neck supple. Normal range of motion.  Lymphadenopathy:     Cervical: No cervical adenopathy.  Skin:    General: Skin is warm and dry.     Findings: No erythema or rash.     Comments: Small puncture wound at the left third tarsal on the palmar aspect of foot with mild surrounding erythema, no warmth, no drainage, nontender to palpation.  Neurological:      General: No focal deficit present.     Mental Status: He is alert and oriented to person, place, and time.  Psychiatric:        Mood and Affect: Mood normal.        Behavior: Behavior normal.    Visual Acuity Right Eye Distance:   Left Eye Distance:   Bilateral Distance:    Right Eye Near:   Left Eye Near:    Bilateral Near:     UC Couse / Diagnostics / Procedures:    EKG  Radiology No results found.  Procedures Procedures (including critical care time)  UC Diagnoses / Final Clinical Impressions(s)   I have reviewed the triage vital signs and the nursing notes.  Pertinent labs & imaging results that were available during my care of the patient were reviewed by me and considered in my medical decision making (see chart for details).    Final diagnoses:  Puncture wound of left foot, initial encounter   Patient was provided with a Tdap and levofloxacin for Pseudomonas coverage.  Return precautions advised.  ED Prescriptions     Medication Sig Dispense Auth. Provider   levofloxacin (LEVAQUIN) 750 MG tablet Take 1 tablet (750 mg total) by mouth daily for 5 days. 5 tablet Theadora Rama Scales, PA-C      PDMP not reviewed this encounter.  Pending results:  Labs Reviewed - No data to display  Medications Ordered in UC: Medications  Tdap (BOOSTRIX) injection 0.5 mL (has no administration in time range)  acetaminophen (TYLENOL) tablet 975 mg (has no administration in time range)    Disposition Upon Discharge:  Condition: stable for discharge home Home: take medications as prescribed; routine discharge instructions as discussed; follow up as advised.  Patient presented with an acute illness with associated systemic symptoms and significant discomfort requiring urgent management. In my opinion, this is a condition that a prudent lay person (someone who possesses an average knowledge of health and medicine) may potentially expect to result in complications if not  addressed urgently such as respiratory distress, impairment of bodily function or dysfunction of bodily organs.   Routine symptom specific, illness specific and/or disease specific instructions were discussed with the patient and/or caregiver at length.   As such, the patient has  been evaluated and assessed, work-up was performed and treatment was provided in alignment with urgent care protocols and evidence based medicine.  Patient/parent/caregiver has been advised that the patient may require follow up for further testing and treatment if the symptoms continue in spite of treatment, as clinically indicated and appropriate.  If the patient was tested for COVID-19, Influenza and/or RSV, then the patient/parent/guardian was advised to isolate at home pending the results of his/her diagnostic coronavirus test and potentially longer if theyre positive. I have also advised pt that if his/her COVID-19 test returns positive, it's recommended to self-isolate for at least 10 days after symptoms first appeared AND until fever-free for 24 hours without fever reducer AND other symptoms have improved or resolved. Discussed self-isolation recommendations as well as instructions for household member/close contacts as per the [facility] and [place] DHHS, and also gave patient the COVID packet with this information.  Patient/parent/caregiver has been advised to return to the [facility] or PCP in 3-5 days if no better; to PCP or the Emergency Department if new signs and symptoms develop, or if the current signs or symptoms continue to change or worsen for further workup, evaluation and treatment as clinically indicated and appropriate  The patient will follow up with their current PCP if and as advised. If the patient does not currently have a PCP we will assist them in obtaining one.   The patient may need specialty follow up if the symptoms continue, in spite of conservative treatment and management, for further workup, evaluation,  consultation and treatment as clinically indicated and appropriate.   Patient/parent/caregiver verbalized understanding and agreement of plan as discussed.  All questions were addressed during visit.  Please see discharge instructions below for further details of plan.  Discharge Instructions:   Discharge Instructions      Please see the enclosed information about your vaccine today and puncture wound care.  Believe that your wound at this time does not appear to be infected and that you would benefit from 5 days of antibiotic therapy.  The tablet that is recommended is levofloxacin and is actually only taken once a day, not twice a day.  You will receive 5 tablets, please take 1 tablet today and then 1 tablet every day thereafter until complete.  Please monitor your wound for signs of increasing infection, if you feel that the wound is getting worse instead of getting better, please go to the emergency room, you may require IV antibiotic therapy.  Thank you for visiting urgent care today.    This office note has been dictated using Teaching laboratory technician.  Unfortunately, and despite my best efforts, this method of dictation can sometimes lead to occasional typographical or grammatical errors.  I apologize in advance if this occurs.     Theadora Rama Scales, PA-C 08/02/21 1157

## 2022-10-12 ENCOUNTER — Ambulatory Visit (HOSPITAL_COMMUNITY)
Admission: EM | Admit: 2022-10-12 | Discharge: 2022-10-12 | Disposition: A | Discharge status: Home / Self Care | Payer: Self-pay | Attending: Family Medicine | Admitting: Family Medicine

## 2022-10-12 ENCOUNTER — Ambulatory Visit (INDEPENDENT_AMBULATORY_CARE_PROVIDER_SITE_OTHER): Payer: Self-pay

## 2022-10-12 ENCOUNTER — Encounter (HOSPITAL_COMMUNITY): Payer: Self-pay | Admitting: *Deleted

## 2022-10-12 ENCOUNTER — Other Ambulatory Visit: Payer: Self-pay

## 2022-10-12 DIAGNOSIS — R051 Acute cough: Secondary | ICD-10-CM

## 2022-10-12 DIAGNOSIS — J441 Chronic obstructive pulmonary disease with (acute) exacerbation: Secondary | ICD-10-CM

## 2022-10-12 DIAGNOSIS — K429 Umbilical hernia without obstruction or gangrene: Secondary | ICD-10-CM

## 2022-10-12 DIAGNOSIS — H5789 Other specified disorders of eye and adnexa: Secondary | ICD-10-CM

## 2022-10-12 MED ORDER — ERYTHROMYCIN 5 MG/GM OP OINT: TOPICAL_OINTMENT | OPHTHALMIC | 0 refills | Status: DC

## 2022-10-12 MED ORDER — ALBUTEROL SULFATE HFA 108 (90 BASE) MCG/ACT IN AERS: 1.0000 | INHALATION_SPRAY | Freq: Four times a day (QID) | RESPIRATORY_TRACT | 0 refills | Status: DC | PRN

## 2022-10-12 MED ORDER — PREDNISONE 10 MG (21) PO TBPK: ORAL_TABLET | ORAL | 0 refills | Status: DC

## 2022-10-12 MED ORDER — TETRACAINE HCL 0.5 % OP SOLN
OPHTHALMIC | Status: AC
  Filled 2022-10-12: qty 4

## 2022-10-12 MED ORDER — FLUORESCEIN SODIUM 1 MG OP STRP
ORAL_STRIP | OPHTHALMIC | Status: AC
  Filled 2022-10-12: qty 1

## 2022-10-12 MED ORDER — SPIRIVA RESPIMAT 2.5 MCG/ACT IN AERS: 2.0000 | INHALATION_SPRAY | Freq: Every day | RESPIRATORY_TRACT | 1 refills | Status: DC

## 2022-10-12 MED ORDER — CEFDINIR 300 MG PO CAPS: 300.0000 mg | ORAL_CAPSULE | Freq: Two times a day (BID) | ORAL | 0 refills | Status: DC

## 2022-10-12 NOTE — ED Provider Notes (Signed)
[facility]    CSN: [id number] Arrival date & time: 10/12/22  1040      History   Chief Complaint Chief Complaint  Patient presents with   Cough   Muscle Pain    HPI Matthew Hoover is a 47 y.o. male.   Patient presents today companied by his daughter help provide translation after he declined video interpreter.  Reports that for the past 3 to 4 days he has had worsening cough.  He has a mild chronic cough but this has significantly worsened in the past few days.  He reports that cough is causing him to have abdominal pain as he does have an umbilical hernia and he has to put pressure on this in order to avoid the pain with coughing spells.  He has never had this repaired in the past.  Denies any associated difficulty with his bowels, persistent bulge, overlying skin change.  Denies any known sick contacts.  Denies additional symptoms including fever, congestion, chest pain, shortness of breath, nausea, vomiting.  Denies any recent antibiotics or steroids.  Denies history of asthma or COPD but he is a current everyday smoker.  In addition, patient reports that a few days ago he had a rock that got into his eye.  He is not concern for retained foreign body but does report has had ongoing eye irritation particularly in the lower eyelid of his left eye since the injury.  He does not wear contacts.  He has tried over-the-counter lubricating eyedrops without improvement.  Has not seen ophthalmologist regularly.  He does not wear glasses or contacts.  Denies any associated visual disturbance, ocular pain, pain with extraocular movement, photophobia.    History reviewed. No pertinent past medical history.  There are no problems to display for this patient.   History reviewed. No pertinent surgical history.     Home Medications    Prior to Admission medications   Medication Sig Start Date End Date Taking? Authorizing Provider  albuterol (VENTOLIN HFA) 108 (90 Base) MCG/ACT  inhaler Inhale 1-2 puffs into the lungs every 6 (six) hours as needed for wheezing or shortness of breath. 10/12/22  Yes Alyha Marines K, PA-C  cefdinir (OMNICEF) 300 MG capsule Take 1 capsule (300 mg total) by mouth 2 (two) times daily. 10/12/22  Yes Syrus Nakama K, PA-C  erythromycin ophthalmic ointment Place a 1/2 inch ribbon of ointment into the lower eyelid of left eye twice daily for 10 days 10/12/22  Yes Airiana Elman K, PA-C  predniSONE (STERAPRED UNI-PAK 21 TAB) 10 MG (21) TBPK tablet As directed 10/12/22  Yes Jayani Rozman K, PA-C  Tiotropium Bromide Monohydrate (SPIRIVA RESPIMAT) 2.5 MCG/ACT AERS Inhale 2 puffs into the lungs daily. 10/12/22  Yes Arrow Emmerich, Noberto Retort, PA-C    Family History History reviewed. No pertinent family history.  Social History Social History   Tobacco Use   Smoking status: Every Day    Types: Cigarettes    Last attempt to quit: 04/15/2016    Years since quitting: 6.4   Smokeless tobacco: Never  Substance Use Topics   Alcohol use: No    Alcohol/week: 0.0 standard drinks of alcohol   Drug use: No     Allergies   Patient has no known allergies.   Review of Systems Review of Systems  Constitutional:  Positive for activity change. Negative for appetite change, fatigue and fever.  HENT:  Negative for congestion, sinus pressure, sneezing and sore throat.   Eyes:  Positive for pain  and redness. Negative for photophobia, discharge, itching and visual disturbance.  Respiratory:  Positive for cough. Negative for shortness of breath.   Cardiovascular:  Negative for chest pain.  Gastrointestinal:  Negative for abdominal pain, diarrhea, nausea and vomiting.  Neurological:  Negative for dizziness, light-headedness and headaches.     Physical Exam Triage Vital Signs ED Triage Vitals  Enc Vitals Group     BP 10/12/22 1214 130/80     Pulse Rate 10/12/22 1214 70     Resp 10/12/22 1214 18     Temp 10/12/22 1214 98.2 F (36.8 C)     Temp src --      SpO2 10/12/22  1214 94 %     Weight --      Height --      Head Circumference --      Peak Flow --      Pain Score 10/12/22 1212 8     Pain Loc --      Pain Edu? --      Excl. in GC? --    No data found.  Updated Vital Signs BP 130/80   Pulse 70   Temp 98.2 F (36.8 C)   Resp 18   SpO2 94%   Visual Acuity Right Eye Distance:   Left Eye Distance:   Bilateral Distance:    Right Eye Near:   Left Eye Near:    Bilateral Near:     Physical Exam Vitals reviewed.  Constitutional:      General: He is awake.     Appearance: Normal appearance. He is well-developed. He is not ill-appearing.     Comments: Very pleasant male appears stated age in no acute distress sitting comfortably in exam room  HENT:     Head: Normocephalic and atraumatic.     Right Ear: Tympanic membrane, ear canal and external ear normal. Tympanic membrane is not erythematous or bulging.     Left Ear: Tympanic membrane, ear canal and external ear normal. Tympanic membrane is not erythematous or bulging.     Nose: Nose normal.     Mouth/Throat:     Pharynx: Uvula midline. Posterior oropharyngeal erythema present. No oropharyngeal exudate.  Eyes:     General: Lids are everted, no foreign bodies appreciated.     Extraocular Movements: Extraocular movements intact.     Conjunctiva/sclera: Conjunctivae normal.     Right eye: Right conjunctiva is not injected.     Left eye: Left conjunctiva is not injected.     Pupils: Pupils are equal, round, and reactive to light.     Left eye: No corneal abrasion or fluorescein uptake. Seidel exam negative.    Comments: Small weight lesion noted palbebral conjunctiva of left lower eyelid without obvious drainage.  Cardiovascular:     Rate and Rhythm: Normal rate and regular rhythm.     Heart sounds: Normal heart sounds, S1 normal and S2 normal. No murmur heard. Pulmonary:     Effort: Pulmonary effort is normal. No accessory muscle usage or respiratory distress.     Breath sounds: No  stridor. Examination of the right-lower field reveals decreased breath sounds. Examination of the left-lower field reveals decreased breath sounds. Decreased breath sounds present. No wheezing, rhonchi or rales.     Comments: Reactive cough with deep breathing Abdominal:     General: Bowel sounds are normal.     Palpations: Abdomen is soft.     Tenderness: There is no abdominal tenderness.     Hernia:  A hernia is present. Hernia is present in the umbilical area.     Comments: Benign abdominal exam.  Umbilical hernia noted that is reducible.  No evidence of incarcerated or strangulated hernia.  Lymphadenopathy:     Head:     Right side of head: No submental, submandibular or tonsillar adenopathy.     Left side of head: No submental, submandibular or tonsillar adenopathy.     Cervical: No cervical adenopathy.  Neurological:     Mental Status: He is alert.  Psychiatric:        Behavior: Behavior is cooperative.      UC Treatments / Results  Labs (all labs ordered are listed, but only abnormal results are displayed) Labs Reviewed - No data to display  EKG   Radiology DG Chest 2 View  Result Date: 10/12/2022 CLINICAL DATA:  Cough, smoking history EXAM: CHEST - 2 VIEW COMPARISON:  05/15/2016 FINDINGS: The heart size and mediastinal contours are within normal limits. Coarsened interstitial markings bilaterally. No focal airspace consolidation, pleural effusion, or pneumothorax. Low lung volumes. The visualized skeletal structures are unremarkable. IMPRESSION: Coarsened interstitial markings bilaterally, which may reflect bronchitic type lung changes. No focal airspace consolidation. Electronically Signed   By: Duanne Guess D.O.   On: 10/12/2022 13:21    Procedures Procedures (including critical care time)  Medications Ordered in UC Medications - No data to display  Initial Impression / Assessment and Plan / UC Course  I have reviewed the triage vital signs and the nursing  notes.  Pertinent labs & imaging results that were available during my care of the patient were reviewed by me and considered in my medical decision making (see chart for details).     Patient is well-appearing, afebrile, nontoxic, nontachycardic.  X-ray was obtained given his decreased aeration of bilateral bases.  X-ray showed bronchitic type changes without focal airspace consolidation.  Given his history of smoking and the changes I am concerned that he may be having a COPD exacerbation.  We discussed that he likely has COPD and will start on Spiriva as well as albuterol.  Will treat current exacerbation with Omnicef and prednisone.  Discussed that he is not to take NSAIDs with prednisone due to risk of GI bleeding.  Can use over-the-counter medication including Mucinex and Flonase.  Recommend that he rest and drink plenty of fluid.  Discussed that ultimately he would need to see primary care to determine if spirometry and lung studies are necessary or referral to specialist.  Will try to establish him with someone via PCP assistance.  Discussed that if his symptoms are proving within a week or if he has any worsening symptoms he needs to be seen immediately.  Strict return precautions given.  No evidence of retained foreign body or corneal abrasion.  I suspect that ongoing irritation is related to conjunctival irritation associated with trauma.  Will start erythromycin ointment twice daily.  Discussed that he is not to touch the tip of medication bottle to his eye and wash hands prior to handling medication in order to prevent contamination of the medicine.  Can use lubricating eyedrops for additional symptom relief.  Discussed that if symptoms or not improving quickly he should follow-up with ophthalmology was given contact information for local provider with instruction to call to schedule an appointment.  Strict return precautions given to which he expressed understanding.  Final Clinical  Impressions(s) / UC Diagnoses   Final diagnoses:  COPD exacerbation (HCC)  Umbilical hernia without obstruction  and without gangrene  Acute cough  Irritation of left eye     Discharge Instructions      We did not see any foreign body in your eye.  There is no scratch on your eye.  Please use erythromycin ointment twice daily for 10 days to provide comfort and help this heal.  Wash hands prior to handling medication and do not touch of medication bottle to the eye.  If your symptoms do not improving quickly please follow-up with ophthalmology; call to schedule appointment.  If anything worsens you should be seen immediately.  I am concerned that you have COPD and your worsening cough is related to a flare of this condition.  We are starting an antibiotic.  Take Omnicef twice daily for 7 days.  Start prednisone taper.  Do not take NSAIDs with this medication due to risk of GI bleeding including aspirin, ibuprofen/Advil, naproxen/Aleve..  Use albuterol every 4-6 hours needed for shortness of breath.  Start to Spiriva daily.  We will establish with a primary care for further evaluation and management.  If anything worsens please return for reevaluation.     ED Prescriptions     Medication Sig Dispense Auth. Provider   erythromycin ophthalmic ointment Place a 1/2 inch ribbon of ointment into the lower eyelid of left eye twice daily for 10 days 3.5 g Ressie Slevin K, PA-C   cefdinir (OMNICEF) 300 MG capsule Take 1 capsule (300 mg total) by mouth 2 (two) times daily. 14 capsule Markeese Boyajian K, PA-C   predniSONE (STERAPRED UNI-PAK 21 TAB) 10 MG (21) TBPK tablet As directed 21 tablet Tishara Pizano K, PA-C   albuterol (VENTOLIN HFA) 108 (90 Base) MCG/ACT inhaler Inhale 1-2 puffs into the lungs every 6 (six) hours as needed for wheezing or shortness of breath. 18 g Shelvy Perazzo K, PA-C   Tiotropium Bromide Monohydrate (SPIRIVA RESPIMAT) 2.5 MCG/ACT AERS Inhale 2 puffs into the lungs daily. 4 g Erasmus Bistline,  Noberto Retort, PA-C      PDMP not reviewed this encounter.   Jeani Hawking, PA-C 10/12/22 1404

## 2022-10-12 NOTE — Discharge Instructions (Addendum)
We did not see any foreign body in your eye.  There is no scratch on your eye.  Please use erythromycin ointment twice daily for 10 days to provide comfort and help this heal.  Wash hands prior to handling medication and do not touch of medication bottle to the eye.  If your symptoms do not improving quickly please follow-up with ophthalmology; call to schedule appointment.  If anything worsens you should be seen immediately.  I am concerned that you have COPD and your worsening cough is related to a flare of this condition.  We are starting an antibiotic.  Take Omnicef twice daily for 7 days.  Start prednisone taper.  Do not take NSAIDs with this medication due to risk of GI bleeding including aspirin, ibuprofen/Advil, naproxen/Aleve..  Use albuterol every 4-6 hours needed for shortness of breath.  Start to Spiriva daily.  We will establish with a primary care for further evaluation and management.  If anything worsens please return for reevaluation.

## 2022-10-12 NOTE — ED Triage Notes (Signed)
Pt has had a cough for 3 days . Pt also reports his Lt ey eis sore. Pt may have had a small rock hit his eye. Pt reports swelling below eye.

## 2022-12-03 ENCOUNTER — Encounter: Payer: Self-pay | Admitting: Nurse Practitioner

## 2022-12-03 ENCOUNTER — Ambulatory Visit (INDEPENDENT_AMBULATORY_CARE_PROVIDER_SITE_OTHER): Payer: Self-pay | Admitting: Nurse Practitioner

## 2022-12-03 VITALS — BP 115/81 | HR 75 | Temp 97.5°F | Ht 69.0 in | Wt 221.2 lb

## 2022-12-03 DIAGNOSIS — Z1321 Encounter for screening for nutritional disorder: Secondary | ICD-10-CM

## 2022-12-03 DIAGNOSIS — F17200 Nicotine dependence, unspecified, uncomplicated: Secondary | ICD-10-CM

## 2022-12-03 DIAGNOSIS — Z0000 Encounter for general adult medical examination without abnormal findings: Secondary | ICD-10-CM

## 2022-12-03 DIAGNOSIS — E1165 Type 2 diabetes mellitus with hyperglycemia: Secondary | ICD-10-CM

## 2022-12-03 DIAGNOSIS — Z130 Encounter for screening for diseases of the blood and blood-forming organs and certain disorders involving the immune mechanism: Secondary | ICD-10-CM

## 2022-12-03 DIAGNOSIS — Z1329 Encounter for screening for other suspected endocrine disorder: Secondary | ICD-10-CM

## 2022-12-03 DIAGNOSIS — Z13228 Encounter for screening for other metabolic disorders: Secondary | ICD-10-CM

## 2022-12-03 DIAGNOSIS — Z1211 Encounter for screening for malignant neoplasm of colon: Secondary | ICD-10-CM

## 2022-12-03 DIAGNOSIS — J449 Chronic obstructive pulmonary disease, unspecified: Secondary | ICD-10-CM

## 2022-12-03 DIAGNOSIS — K219 Gastro-esophageal reflux disease without esophagitis: Secondary | ICD-10-CM

## 2022-12-03 DIAGNOSIS — R490 Dysphonia: Secondary | ICD-10-CM

## 2022-12-03 LAB — POCT GLYCOSYLATED HEMOGLOBIN (HGB A1C): Hemoglobin A1C: 6.6 % — AB (ref 4.0–5.6)

## 2022-12-03 MED ORDER — SPIRIVA RESPIMAT 2.5 MCG/ACT IN AERS: 2.0000 | INHALATION_SPRAY | Freq: Every day | RESPIRATORY_TRACT | 1 refills | Status: DC

## 2022-12-03 MED ORDER — ALBUTEROL SULFATE HFA 108 (90 BASE) MCG/ACT IN AERS: 1.0000 | INHALATION_SPRAY | Freq: Four times a day (QID) | RESPIRATORY_TRACT | 1 refills | Status: DC | PRN

## 2022-12-03 NOTE — Assessment & Plan Note (Signed)
Albuterol inhaler 1-2 puffs every 6 hours as needed, Spiriva 2 puffs daily refilled Smoking cessation encouraged Patient referred to pulmonology    Recent chest x-ray below EXAM: CHEST - 2 VIEW   COMPARISON:  05/15/2016   FINDINGS: The heart size and mediastinal contours are within normal limits. Coarsened interstitial markings bilaterally. No focal airspace consolidation, pleural effusion, or pneumothorax. Low lung volumes. The visualized skeletal structures are unremarkable.   IMPRESSION: Coarsened interstitial markings bilaterally, which may reflect bronchitic type lung changes. No focal airspace consolidation.

## 2022-12-03 NOTE — Assessment & Plan Note (Addendum)
Annual exam as documented.  Counseling done include healthy lifestyle involving committing to 150 minutes of exercise per week, heart healthy diet, and attaining healthy weight. The importance of adequate sleep also discussed.  Regular use of seat belt and home safety were also discussed . Changes in health habits are decided on by patient with goals and time frames set for achieving them. Immunization and cancer screening  needs are specifically addressed at this visit.    Routine fasting labs ordered Encouraged to get Tdap vaccine

## 2022-12-03 NOTE — Assessment & Plan Note (Signed)
Non erythematous lesion noted close to the root of the tongue Due to complaints of hoarseness , I will refer the patient to ENT

## 2022-12-03 NOTE — Patient Instructions (Signed)

## 2022-12-03 NOTE — Assessment & Plan Note (Signed)
Education on GERD provided Continue OTC omeprazole as needed

## 2022-12-03 NOTE — Assessment & Plan Note (Signed)
Smokes about 5-6 cigarettes /day  Asked about quitting: confirms that he/she currently smokes cigarettes Advise to quit smoking: Educated about QUITTING to reduce the risk of cancer, cardio and cerebrovascular disease. Assess willingness: Unwilling to quit at this time, but is working on cutting back. Assist with counseling and pharmacotherapy: Counseled for 5 minutes and literature provided. Arrange for follow up: follow up in 4 months and continue to offer help.  

## 2022-12-03 NOTE — Assessment & Plan Note (Signed)
Lab Results  Component Value Date   HGBA1C 6.6 (A) 12/03/2022  New diagnosis We discussed management with diet and exercise Follow-up in 4 months Patient counseled on low-carb modified diet Diabetic foot exam completed , Discussed need for diabetic eye exam at next visit Check lipid panel, urine microalbumin

## 2022-12-03 NOTE — Progress Notes (Signed)
New Patient Office Visit  Subjective:  Patient ID: Matthew Hoover, male    DOB: [birth year]  Age: 47 y.o. MRN: [id number]  CC:  Chief Complaint  Patient presents with   Establish Care    Annual physical.    HPI Matthew Hoover is a 47 y.o. male with past medical history of COPD who presents for establishing care and for annual physical.   COPD. Somkes 5-6 cigarettes daily, has intermittent,  wheezing, cough productive.  He was already emergency room on Oct 12, 2022 , was treated for COPD exacerbation .patient stated that he feels better, has  had hoarseness since the day he went to the ER.  He has noticed some lesions on his tongue ,no fever, chills, bloody sputum, difficulty swallowing.  He has completed full course of antibiotics ordered.  Spiriva and albuterol inhaler was also ordered, needs refills of his inhalers.   GERD.  Takes OTC omeprazole sometimes.  Has hoarseness but denies nausea, vomiting, abdominal pain.  Due for colon cancer screening Cologuard ordered.  Patient encouraged to get Tdap vaccine at the pharmacy  Interpretation services provided via [facility] health iPad.  Patient was accompanied by his wife   Past Medical History:  Diagnosis Date   COPD (chronic obstructive pulmonary disease) (HCC)    Diabetes mellitus without complication (HCC)    GERD (gastroesophageal reflux disease)    Tobacco use disorder     History reviewed. No pertinent surgical history.  Family History  Problem Relation Age of Onset   Diabetes Brother    Stroke Neg Hx    Heart attack Neg Hx     Social History   Socioeconomic History   Marital status: Married    Spouse name: Not on file   Number of children: 5   Years of education: Not on file   Highest education level: Not on file  Occupational History   Not on file  Tobacco Use   Smoking status: Every Day    Packs/day: .25    Types: Cigarettes    Last attempt to quit: 04/15/2016    Years since quitting: 6.6   Smokeless  tobacco: Never  Substance and Sexual Activity   Alcohol use: No    Alcohol/week: 0.0 standard drinks of alcohol   Drug use: No   Sexual activity: Not on file  Other Topics Concern   Not on file  Social History Narrative   Lives with family .    Social Determinants of Health   Financial Resource Strain: Not on file  Food Insecurity: Not on file  Transportation Needs: Not on file  Physical Activity: Not on file  Stress: Not on file  Social Connections: Not on file  Intimate Partner Violence: Not on file    ROS Review of Systems  Constitutional:  Negative for activity change, appetite change, chills, diaphoresis, fatigue, fever and unexpected weight change.  HENT:  Negative for congestion, dental problem, drooling and ear discharge.   Eyes:  Negative for pain, discharge, redness and itching.  Respiratory:  Negative for apnea, cough, choking, chest tightness, shortness of breath and wheezing.   Cardiovascular: Negative.  Negative for chest pain, palpitations and leg swelling.  Gastrointestinal:  Negative for abdominal distention, abdominal pain, anal bleeding, blood in stool, constipation, diarrhea and vomiting.  Endocrine: Negative for polydipsia, polyphagia and polyuria.  Genitourinary:  Negative for difficulty urinating, flank pain, frequency and genital sores.  Musculoskeletal: Negative.  Negative for arthralgias, back pain, gait problem and joint swelling.  Skin:  Negative for color change, pallor and rash.  Neurological:  Negative for dizziness, facial asymmetry, light-headedness, numbness and headaches.  Psychiatric/Behavioral:  Negative for agitation, behavioral problems, confusion, hallucinations, self-injury, sleep disturbance and suicidal ideas.     Objective:   Today's Vitals: BP 115/81   Pulse 75   Temp (!) 97.5 F (36.4 C)   Ht 5\' 9"  (1.753 m)   Wt 221 lb 3.2 oz (100.3 kg)   SpO2 98%   BMI 32.67 kg/m   Physical Exam Vitals and nursing note reviewed. Exam  conducted with a chaperone present.  Constitutional:      General: He is not in acute distress.    Appearance: Normal appearance. He is obese. He is not ill-appearing, toxic-appearing or diaphoretic.  HENT:     Right Ear: Tympanic membrane, ear canal and external ear normal. There is no impacted cerumen.     Left Ear: Tympanic membrane, ear canal and external ear normal. There is no impacted cerumen.     Nose: Nose normal. No congestion or rhinorrhea.     Mouth/Throat:     Mouth: Mucous membranes are moist. No injury or lacerations.     Pharynx: Oropharynx is clear. No oropharyngeal exudate, posterior oropharyngeal erythema or uvula swelling.     Tonsils: No tonsillar exudate or tonsillar abscesses. 0 on the right. 0 on the left.     Comments: Known erythematous lesions noted close to the root of the tongue Eyes:     General: No scleral icterus.       Right eye: No discharge.        Left eye: No discharge.     Extraocular Movements: Extraocular movements intact.     Conjunctiva/sclera: Conjunctivae normal.  Neck:     Vascular: No carotid bruit.  Cardiovascular:     Rate and Rhythm: Normal rate and regular rhythm.     Pulses: Normal pulses.     Heart sounds: Normal heart sounds. No murmur heard.    No friction rub. No gallop.  Pulmonary:     Effort: Pulmonary effort is normal. No respiratory distress.     Breath sounds: Normal breath sounds. No stridor. No wheezing, rhonchi or rales.  Chest:     Chest wall: No tenderness.  Abdominal:     General: Bowel sounds are normal. There is no distension.     Palpations: Abdomen is soft. There is no mass.     Tenderness: There is no abdominal tenderness. There is no right CVA tenderness, left CVA tenderness, guarding or rebound.     Hernia: No hernia is present.     Comments: Umbilical hernia, no tenderness on palpation  Musculoskeletal:        General: No swelling, tenderness, deformity or signs of injury.     Cervical back: Normal range  of motion and neck supple. No rigidity or tenderness.     Right lower leg: No edema.     Left lower leg: No edema.  Lymphadenopathy:     Cervical: No cervical adenopathy.  Skin:    General: Skin is warm and dry.     Capillary Refill: Capillary refill takes less than 2 seconds.     Coloration: Skin is not jaundiced or pale.     Findings: No bruising, erythema, lesion or rash.  Neurological:     Mental Status: He is alert and oriented to person, place, and time.     Cranial Nerves: No cranial nerve deficit.     Sensory: No  sensory deficit.     Motor: No weakness.     Coordination: Coordination normal.     Gait: Gait normal.     Deep Tendon Reflexes: Reflexes normal.  Psychiatric:        Mood and Affect: Mood normal.        Behavior: Behavior normal.        Thought Content: Thought content normal.        Judgment: Judgment normal.     Assessment & Plan:   Problem List Items Addressed This Visit       Respiratory   Chronic obstructive pulmonary disease (HCC)    Albuterol inhaler 1-2 puffs every 6 hours as needed, Spiriva 2 puffs daily refilled Smoking cessation encouraged Patient referred to pulmonology    Recent chest x-ray below EXAM: CHEST - 2 VIEW   COMPARISON:  05/15/2016   FINDINGS: The heart size and mediastinal contours are within normal limits. Coarsened interstitial markings bilaterally. No focal airspace consolidation, pleural effusion, or pneumothorax. Low lung volumes. The visualized skeletal structures are unremarkable.   IMPRESSION: Coarsened interstitial markings bilaterally, which may reflect bronchitic type lung changes. No focal airspace consolidation.          Relevant Medications   Tiotropium Bromide Monohydrate (SPIRIVA RESPIMAT) 2.5 MCG/ACT AERS   albuterol (VENTOLIN HFA) 108 (90 Base) MCG/ACT inhaler   Other Relevant Orders   Ambulatory referral to Pulmonology     Digestive   Gastroesophageal reflux disease    Education on GERD  provided Continue OTC omeprazole as needed        Endocrine   Type 2 diabetes mellitus with hyperglycemia, without long-term current use of insulin (HCC)    Lab Results  Component Value Date   HGBA1C 6.6 (A) 12/03/2022  New diagnosis We discussed management with diet and exercise Follow-up in 4 months Patient counseled on low-carb modified diet Diabetic foot exam completed , Discussed need for diabetic eye exam at next visit Check lipid panel, urine microalbumin        Relevant Orders   POCT glycosylated hemoglobin (Hb A1C) (Completed)     Other   Tobacco use disorder    Smokes about 5-6 cigarettes /day  Asked about quitting: confirms that he/she currently smokes cigarettes Advise to quit smoking: Educated about QUITTING to reduce the risk of cancer, cardio and cerebrovascular disease. Assess willingness: Unwilling to quit at this time, but is working on cutting back. Assist with counseling and pharmacotherapy: Counseled for 5 minutes and literature provided. Arrange for follow up: follow up in 4 months and continue to offer help.       Screening for endocrine, nutritional, metabolic and immunity disorder   Relevant Orders   CBC with Differential/Platelet   CMP14+EGFR   Lipid panel   TSH   HIV Antibody (routine testing w rflx)   Hepatitis C antibody   Microalbumin / creatinine urine ratio   Hoarseness of voice    Non erythematous lesion noted close to the root of the tongue Due to complaints of hoarseness , I will refer the patient to ENT      Relevant Orders   Ambulatory referral to ENT   Annual physical exam - Primary    Annual exam as documented.  Counseling done include healthy lifestyle involving committing to 150 minutes of exercise per week, heart healthy diet, and attaining healthy weight. The importance of adequate sleep also discussed.  Regular use of seat belt and home safety were also discussed . Changes in  health habits are decided on by patient with  goals and time frames set for achieving them. Immunization and cancer screening  needs are specifically addressed at this visit.    Routine fasting labs ordered Encouraged to get Tdap vaccine      Other Visit Diagnoses     Screening for colon cancer       Relevant Orders   Cologuard       Outpatient Encounter Medications as of 12/03/2022  Medication Sig   [DISCONTINUED] albuterol (VENTOLIN HFA) 108 (90 Base) MCG/ACT inhaler Inhale 1-2 puffs into the lungs every 6 (six) hours as needed for wheezing or shortness of breath.   [DISCONTINUED] Tiotropium Bromide Monohydrate (SPIRIVA RESPIMAT) 2.5 MCG/ACT AERS Inhale 2 puffs into the lungs daily.   albuterol (VENTOLIN HFA) 108 (90 Base) MCG/ACT inhaler Inhale 1-2 puffs into the lungs every 6 (six) hours as needed for wheezing or shortness of breath.   cefdinir (OMNICEF) 300 MG capsule Take 1 capsule (300 mg total) by mouth 2 (two) times daily. (Patient not taking: Reported on 12/03/2022)   erythromycin ophthalmic ointment Place a 1/2 inch ribbon of ointment into the lower eyelid of left eye twice daily for 10 days (Patient not taking: Reported on 12/03/2022)   predniSONE (STERAPRED UNI-PAK 21 TAB) 10 MG (21) TBPK tablet As directed (Patient not taking: Reported on 12/03/2022)   Tiotropium Bromide Monohydrate (SPIRIVA RESPIMAT) 2.5 MCG/ACT AERS Inhale 2 puffs into the lungs daily.   No facility-administered encounter medications on file as of 12/03/2022.    Follow-up: Return in about 4 months (around 04/05/2023) for Fasting labs next week.   Donell Beers, FNP

## 2022-12-06 ENCOUNTER — Other Ambulatory Visit: Payer: Self-pay

## 2022-12-06 DIAGNOSIS — Z130 Encounter for screening for diseases of the blood and blood-forming organs and certain disorders involving the immune mechanism: Secondary | ICD-10-CM

## 2022-12-07 LAB — MICROALBUMIN / CREATININE URINE RATIO
Creatinine, Urine: 213.5 mg/dL
Microalb/Creat Ratio: 2 mg/g creat (ref 0–29)
Microalbumin, Urine: 4.8 ug/mL

## 2022-12-07 LAB — CBC WITH DIFFERENTIAL/PLATELET
Basophils Absolute: 0.1 10*3/uL (ref 0.0–0.2)
Basos: 1 %
EOS (ABSOLUTE): 0.1 10*3/uL (ref 0.0–0.4)
Eos: 2 %
Hematocrit: 46 % (ref 37.5–51.0)
Hemoglobin: 15 g/dL (ref 13.0–17.7)
Immature Grans (Abs): 0 10*3/uL (ref 0.0–0.1)
Immature Granulocytes: 0 %
Lymphocytes Absolute: 2.6 10*3/uL (ref 0.7–3.1)
Lymphs: 33 %
MCH: 28 pg (ref 26.6–33.0)
MCHC: 32.6 g/dL (ref 31.5–35.7)
MCV: 86 fL (ref 79–97)
Monocytes Absolute: 0.6 10*3/uL (ref 0.1–0.9)
Monocytes: 8 %
Neutrophils Absolute: 4.4 10*3/uL (ref 1.4–7.0)
Neutrophils: 56 %
Platelets: 146 10*3/uL — ABNORMAL LOW (ref 150–450)
RBC: 5.36 x10E6/uL (ref 4.14–5.80)
RDW: 14.3 % (ref 11.6–15.4)
WBC: 7.8 10*3/uL (ref 3.4–10.8)

## 2022-12-07 LAB — LIPID PANEL
Chol/HDL Ratio: 7.2 ratio — ABNORMAL HIGH (ref 0.0–5.0)
Cholesterol, Total: 208 mg/dL — ABNORMAL HIGH (ref 100–199)
HDL: 29 mg/dL — ABNORMAL LOW (ref >39)
LDL Chol Calc (NIH): 113 mg/dL — ABNORMAL HIGH (ref 0–99)
Triglycerides: 378 mg/dL — ABNORMAL HIGH (ref 0–149)
VLDL Cholesterol Cal: 66 mg/dL — ABNORMAL HIGH (ref 5–40)

## 2022-12-07 LAB — CMP14+EGFR
ALT: 46 IU/L — ABNORMAL HIGH (ref 0–44)
AST: 32 IU/L (ref 0–40)
Albumin: 4.1 g/dL (ref 4.1–5.1)
Alkaline Phosphatase: 118 IU/L (ref 44–121)
BUN/Creatinine Ratio: 15 (ref 9–20)
BUN: 15 mg/dL (ref 6–24)
Bilirubin Total: <0.2 mg/dL (ref 0.0–1.2)
CO2: 21 mmol/L (ref 20–29)
Calcium: 8.7 mg/dL (ref 8.7–10.2)
Chloride: 103 mmol/L (ref 96–106)
Creatinine, Ser: 1.01 mg/dL (ref 0.76–1.27)
Globulin, Total: 2.3 g/dL (ref 1.5–4.5)
Glucose: 124 mg/dL — ABNORMAL HIGH (ref 70–99)
Potassium: 4.1 mmol/L (ref 3.5–5.2)
Sodium: 139 mmol/L (ref 134–144)
Total Protein: 6.4 g/dL (ref 6.0–8.5)
eGFR: 92 mL/min/{1.73_m2} (ref >59)

## 2022-12-07 LAB — TSH: TSH: 2.09 u[IU]/mL (ref 0.450–4.500)

## 2022-12-07 LAB — HIV ANTIBODY (ROUTINE TESTING W REFLEX): HIV Screen 4th Generation wRfx: NONREACTIVE

## 2022-12-07 LAB — HEPATITIS C ANTIBODY: Hep C Virus Ab: NONREACTIVE

## 2022-12-14 ENCOUNTER — Telehealth: Payer: Self-pay | Admitting: Nurse Practitioner

## 2022-12-14 NOTE — Telephone Encounter (Signed)
Pt called and needs results  Spanish speaking

## 2022-12-15 NOTE — Telephone Encounter (Signed)
Called pt and inform results.Gh 

## 2023-01-20 ENCOUNTER — Other Ambulatory Visit: Payer: Self-pay | Admitting: Nurse Practitioner

## 2023-01-20 NOTE — Telephone Encounter (Signed)
Please advise Kh 

## 2023-01-26 ENCOUNTER — Ambulatory Visit (INDEPENDENT_AMBULATORY_CARE_PROVIDER_SITE_OTHER): Payer: Self-pay | Admitting: Pulmonary Disease

## 2023-01-26 ENCOUNTER — Encounter (HOSPITAL_BASED_OUTPATIENT_CLINIC_OR_DEPARTMENT_OTHER): Payer: Self-pay | Admitting: Pulmonary Disease

## 2023-01-26 ENCOUNTER — Other Ambulatory Visit (HOSPITAL_COMMUNITY): Payer: Self-pay

## 2023-01-26 VITALS — BP 118/84 | HR 80 | Resp 16 | Ht 69.0 in | Wt 223.8 lb

## 2023-01-26 DIAGNOSIS — J411 Mucopurulent chronic bronchitis: Secondary | ICD-10-CM

## 2023-01-26 DIAGNOSIS — F17200 Nicotine dependence, unspecified, uncomplicated: Secondary | ICD-10-CM

## 2023-01-26 MED ORDER — FLUTICASONE-SALMETEROL 250-50 MCG/ACT IN AEPB: 1.0000 | INHALATION_SPRAY | Freq: Two times a day (BID) | RESPIRATORY_TRACT | 5 refills | Status: DC

## 2023-01-26 NOTE — Patient Instructions (Signed)
Chronic bronchitis Asthmatic bronchitis --STOP Spiriva --START Wixela 250-50 mcg ONE puff in the morning and evening. Printed script given --CONTINUE Albuterol TWO puffs AS NEEDED for shortness of breath and wheezing --ORDER pulmonary function tests  Tobacco abuse Patient is an active smoker. We discussed smoking cessation for 5 minutes. We discussed triggers and stressors and ways to deal with them. We discussed barriers to continued smoking and benefits of smoking cessation. Provided patient with information cessation techniques and interventions including [place] quitline.  Reflux --Minimize spicy foods

## 2023-01-26 NOTE — Progress Notes (Signed)
Subjective:   PATIENT ID: Matthew Hoover GENDER: male DOB: August 31, 1975, MRN: [id number]  Chief Complaint  Patient presents with   Consult    COPD. Has been using inhalers one one day the other the next. Not using them every day.     Reason for Visit: New consult for possible COPD  [name] is a 47 year old active smoker with COPD, DM2, GERD who presents for evaluation for possible COPD.  He recently established care with PCP, NP [facility]. Seen for ED follow-up for COPD exacerbation on 10/12/22. Started on Spiriva and albuterol. Referred to Pulmonary.  He reports chronic cough x 2-3 months. He reports respiratory infection/chronic bronchitis with productive cough, wheezing and shortness of breath 2-3 times a years. 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 Sickness can last up to month and recovers much slower compared to others. Will have lingering productive cough. He took the Spiriva for two months but unsure if it improved.   Denies history of childhood asthma but with activity wife can hear audible wheezing. It occurs at night. Wife reports snoring.   Social History: Smoked 20 years. Smokes 4-5 cigarettes. Does not vape  Environmental exposures: Painting, insulation, drywall dust, chickens x 4 months  I have personally reviewed patient's past medical/family/social history, allergies, current medications.  Past Medical History:  Diagnosis Date   COPD (chronic obstructive pulmonary disease) (HCC)    Diabetes mellitus without complication (HCC)    GERD (gastroesophageal reflux disease)    Tobacco use disorder      Family History  Problem Relation Age of Onset   Diabetes Brother    Stroke Neg Hx    Heart attack Neg Hx      Social History   Occupational History   Not on file  Tobacco Use   Smoking status: Every Day    Current packs/day: 0.00    Types: Cigarettes    Last attempt to quit: 04/15/2016     Years since quitting: 6.7   Smokeless tobacco: Never  Substance and Sexual Activity   Alcohol use: No    Alcohol/week: 0.0 standard drinks of alcohol   Drug use: No   Sexual activity: Not on file    No Known Allergies   Outpatient Medications Prior to Visit  Medication Sig Dispense Refill   albuterol (VENTOLIN HFA) 108 (90 Base) MCG/ACT inhaler INHALE 1 TO 2 PUFFS INTO THE LUNGS EVERY 6 HOURS AS NEEDED FOR WHEEZING OR SHORTNESS OF BREATH 18 g 1   Tiotropium Bromide Monohydrate (SPIRIVA RESPIMAT) 2.5 MCG/ACT AERS Inhale 2 puffs into the lungs daily. 4 g 1   cefdinir (OMNICEF) 300 MG capsule Take 1 capsule (300 mg total) by mouth 2 (two) times daily. (Patient not taking: Reported on 12/03/2022) 14 capsule 0   erythromycin ophthalmic ointment Place a 1/2 inch ribbon of ointment into the lower eyelid of left eye twice daily for 10 days (Patient not taking: Reported on 12/03/2022) 3.5 g 0   predniSONE (STERAPRED UNI-PAK 21 TAB) 10 MG (21) TBPK tablet As directed (Patient not taking: Reported on 12/03/2022) 21 tablet 0   No facility-administered medications prior to visit.    Review of Systems  Constitutional:  Negative for chills, diaphoresis, fever, malaise/fatigue and weight loss.  HENT:  Negative for congestion.   Respiratory:  Positive for cough, sputum production, shortness of breath and wheezing. Negative for hemoptysis.   Cardiovascular:  Negative  for chest pain, palpitations and leg swelling.     Objective:   Vitals:   01/26/23 0913  BP: 118/84  Pulse: 80  Resp: 16  SpO2: 95%  Weight: 223 lb 12.3 oz (101.5 kg)  Height: 5\' 9"  (1.753 m)   SpO2: 95 %  Physical Exam: General: Well-appearing, no acute distress HENT: [place], AT Eyes: EOMI, no scleral icterus Respiratory: Clear to auscultation bilaterally.  No crackles, wheezing or rales Cardiovascular: RRR, -M/R/G, no JVD Extremities:-Edema,-tenderness Neuro: AAO x4, CNII-XII grossly intact Psych: Normal mood, normal  affect  Data Reviewed:  Imaging: CXR - Coarse interstitial changes. No acute infiltrate effusion or edema  PFT: None on file  Labs: CBC    Component Value Date/Time   WBC 7.8 12/06/2022 0911   RBC 5.36 12/06/2022 0911   HGB 15.0 12/06/2022 0911   HCT 46.0 12/06/2022 0911   PLT 146 (L) 12/06/2022 0911   MCV 86 12/06/2022 0911   MCH 28.0 12/06/2022 0911   MCHC 32.6 12/06/2022 0911   RDW 14.3 12/06/2022 0911   LYMPHSABS 2.6 12/06/2022 0911   EOSABS 0.1 12/06/2022 0911   BASOSABS 0.1 12/06/2022 0911   Absolute eos 12/06/22 - 100     Assessment & Plan:   Discussion: 47 year old active smoker with COPD, DM2, GERD who presents for evaluation for possible COPD. Review of his symptoms consistent with chronic bronchitis at minimum with possible asthma with COPD overlap. Discussed clinical course and management of COPD/asthma including smoking cessation, bronchodilator regimen, preventive care including vaccinations and action plan for exacerbation. No insurance so provided resources like goodrx.com for [employer]. Will consider sleep evaluation in the future but would need insurance/financial aid assistance first.  Chronic bronchitis Asthmatic bronchitis --STOP Spiriva --START Wixela 250-50 mcg ONE puff in the morning and evening. Printed script given --CONTINUE Albuterol TWO puffs AS NEEDED for shortness of breath and wheezing --ORDER pulmonary function tests  Tobacco abuse Patient is an active smoker. We discussed smoking cessation for 5 minutes. We discussed triggers and stressors and ways to deal with them. We discussed barriers to continued smoking and benefits of smoking cessation. Provided patient with information cessation techniques and interventions including [place] quitline. --Due to his low nicotine use. Advised avoiding nicotine patches  Reflux --Minimize spicy foods   Health Maintenance Immunization History  Administered Date(s) Administered   PFIZER(Purple  Top)SARS-COV-2 Vaccination 10/20/2019, 11/12/2019   Tdap 08/02/2021   CT Lung Screen- not qualified  Orders Placed This Encounter  Procedures   Pulmonary function test    Standing Status:   Future    Standing Expiration Date:   01/26/2024    Order Specific Question:   Where should this test be performed?    Answer:   [facility]    Order Specific Question:   Full PFT: includes the following: basic spirometry, spirometry pre & post bronchodilator, diffusion capacity (DLCO), lung volumes    Answer:   Full PFT   Meds ordered this encounter  Medications   fluticasone-salmeterol (WIXELA INHUB) 250-50 MCG/ACT AEPB    Sig: Inhale 1 puff into the lungs in the morning and at bedtime.    Dispense:  60 each    Refill:  5    Return for after PFT in October.  I have spent a total time of 45-minutes on the day of the appointment reviewing prior documentation, coordinating care and discussing medical diagnosis and plan with the patient/family. Imaging, labs and tests included in this note have been reviewed and interpreted  independently by me.  Xavior Niazi Mechele Collin, MD [facility] 01/26/2023 9:52 AM  Office Number [phone]

## 2023-02-01 ENCOUNTER — Ambulatory Visit (INDEPENDENT_AMBULATORY_CARE_PROVIDER_SITE_OTHER): Payer: Self-pay | Admitting: Nurse Practitioner

## 2023-02-01 ENCOUNTER — Encounter: Payer: Self-pay | Admitting: Nurse Practitioner

## 2023-02-01 VITALS — BP 125/89 | HR 62 | Temp 97.1°F | Wt 224.6 lb

## 2023-02-01 DIAGNOSIS — F17200 Nicotine dependence, unspecified, uncomplicated: Secondary | ICD-10-CM

## 2023-02-01 DIAGNOSIS — E1165 Type 2 diabetes mellitus with hyperglycemia: Secondary | ICD-10-CM

## 2023-02-01 DIAGNOSIS — R490 Dysphonia: Secondary | ICD-10-CM

## 2023-02-01 DIAGNOSIS — J449 Chronic obstructive pulmonary disease, unspecified: Secondary | ICD-10-CM

## 2023-02-01 DIAGNOSIS — R748 Abnormal levels of other serum enzymes: Secondary | ICD-10-CM

## 2023-02-01 DIAGNOSIS — E785 Hyperlipidemia, unspecified: Secondary | ICD-10-CM

## 2023-02-01 NOTE — Assessment & Plan Note (Signed)
Lab Results  Component Value Date   ALT 46 (H) 12/06/2022   AST 32 12/06/2022   ALKPHOS 118 12/06/2022   BILITOT <0.2 12/06/2022  Does not drink alcohol, no abdominal pain Patient encouraged to lose weight

## 2023-02-01 NOTE — Assessment & Plan Note (Addendum)
Patient wants to make lifestyle changes for now Encouraged to avoid sugar sweets soda, lose weight Diabetic foot exam completed Rechecking lipid panel today .  We discussed starting a statin if his lipid panel remains elevated

## 2023-02-01 NOTE — Patient Instructions (Addendum)

## 2023-02-01 NOTE — Assessment & Plan Note (Addendum)
Lab Results  Component Value Date   CHOL 208 (H) 12/06/2022   HDL 29 (L) 12/06/2022   LDLCALC 113 (H) 12/06/2022   TRIG 378 (H) 12/06/2022   CHOLHDL 7.2 (H) 12/06/2022   Has not made any changes to his diet  Checking lipid panel today, will start patient on a statin if his LDL and triglyceride remains elevated

## 2023-02-01 NOTE — Progress Notes (Signed)
Established Patient Office Visit  Subjective:  Patient ID: Matthew Hoover, male    DOB: [birth year]  Age: 47 y.o. MRN: [id number]  CC:  Chief Complaint  Patient presents with   Diabetes    Follow up    HPI Matthew Hoover is a 47 y.o. male  has a past medical history of COPD (chronic obstructive pulmonary disease) (HCC), Diabetes mellitus without complication (HCC), GERD (gastroesophageal reflux disease), Hyperlipidemia, and Tobacco use disorder.   Patient presents for follow-up for hyperlipidemia.  He is accompanied by his wife.  Hyperlipidemia .currently not on medication patient stated that he has not made any lifestyle changes.  COPD reports that he is breathing is okay, he has been decreasing the amount of cigarettes he smokes daily, currently smokes 4 cigarettes daily.  Has albuterol inhaler ordered and fluticasone-salmeterol.  Stated that he did not pick up the prescription for fluticasone salmeterol inhaler.  Patient encouraged to pick up the prescription and use as ordered.  Follow-up with pulmonology for a PFT in October  Interpretation services provided via conjunctiva  Has ENT follow up for hoarseness  02/16/2023.     Past Medical History:  Diagnosis Date   COPD (chronic obstructive pulmonary disease) (HCC)    Diabetes mellitus without complication (HCC)    GERD (gastroesophageal reflux disease)    Hyperlipidemia    Tobacco use disorder     History reviewed. No pertinent surgical history.  Family History  Problem Relation Age of Onset   Diabetes Brother    Stroke Neg Hx    Heart attack Neg Hx     Social History   Socioeconomic History   Marital status: Married    Spouse name: Not on file   Number of children: 5   Years of education: Not on file   Highest education level: Not on file  Occupational History   Not on file  Tobacco Use   Smoking status: Every Day    Current packs/day: 0.00    Types: Cigarettes    Last attempt to quit: 04/15/2016     Years since quitting: 6.8   Smokeless tobacco: Never  Substance and Sexual Activity   Alcohol use: No    Alcohol/week: 0.0 standard drinks of alcohol   Drug use: No   Sexual activity: Not on file  Other Topics Concern   Not on file  Social History Narrative   Lives with family .    Social Determinants of Health   Financial Resource Strain: Not on file  Food Insecurity: Not on file  Transportation Needs: Not on file  Physical Activity: Not on file  Stress: Not on file  Social Connections: Not on file  Intimate Partner Violence: Not on file    Outpatient Medications Prior to Visit  Medication Sig Dispense Refill   albuterol (VENTOLIN HFA) 108 (90 Base) MCG/ACT inhaler INHALE 1 TO 2 PUFFS INTO THE LUNGS EVERY 6 HOURS AS NEEDED FOR WHEEZING OR SHORTNESS OF BREATH (Patient not taking: Reported on 02/01/2023) 18 g 1   fluticasone-salmeterol (WIXELA INHUB) 250-50 MCG/ACT AEPB Inhale 1 puff into the lungs in the morning and at bedtime. (Patient not taking: Reported on 02/01/2023) 60 each 5   Tiotropium Bromide Monohydrate (SPIRIVA RESPIMAT) 2.5 MCG/ACT AERS Inhale 2 puffs into the lungs daily. (Patient not taking: Reported on 02/01/2023) 4 g 1   No facility-administered medications prior to visit.    No Known Allergies  ROS Review of Systems  Constitutional:  Negative for activity change, appetite change,  chills, diaphoresis, fatigue, fever and unexpected weight change.  HENT:  Negative for congestion, dental problem, drooling and ear discharge.   Respiratory:  Negative for apnea, cough, choking, chest tightness, shortness of breath and wheezing.   Cardiovascular: Negative.  Negative for chest pain, palpitations and leg swelling.  Gastrointestinal:  Negative for abdominal distention, abdominal pain, anal bleeding, blood in stool, constipation, diarrhea and vomiting.  Endocrine: Negative for polydipsia, polyphagia and polyuria.  Genitourinary:  Negative for difficulty urinating, flank  pain, frequency and genital sores.  Musculoskeletal: Negative.  Negative for arthralgias, back pain, gait problem and joint swelling.  Skin:  Negative for color change, pallor and rash.  Neurological:  Negative for dizziness, facial asymmetry, light-headedness, numbness and headaches.  Psychiatric/Behavioral:  Negative for agitation, behavioral problems, confusion, hallucinations, self-injury, sleep disturbance and suicidal ideas.       Objective:    Physical Exam Vitals and nursing note reviewed.  Constitutional:      General: He is not in acute distress.    Appearance: Normal appearance. He is obese. He is not ill-appearing, toxic-appearing or diaphoretic.  HENT:     Mouth/Throat:     Mouth: Mucous membranes are moist.     Pharynx: Oropharynx is clear. No oropharyngeal exudate or posterior oropharyngeal erythema.  Eyes:     General: No scleral icterus.       Right eye: No discharge.        Left eye: No discharge.     Extraocular Movements: Extraocular movements intact.     Conjunctiva/sclera: Conjunctivae normal.  Cardiovascular:     Rate and Rhythm: Normal rate and regular rhythm.     Pulses: Normal pulses.     Heart sounds: Normal heart sounds. No murmur heard.    No friction rub. No gallop.  Pulmonary:     Effort: Pulmonary effort is normal. No respiratory distress.     Breath sounds: Normal breath sounds. No stridor. No wheezing, rhonchi or rales.  Chest:     Chest wall: No tenderness.  Abdominal:     General: There is no distension.     Palpations: Abdomen is soft.     Tenderness: There is no abdominal tenderness. There is no right CVA tenderness, left CVA tenderness or guarding.  Musculoskeletal:        General: No swelling, tenderness, deformity or signs of injury.     Right lower leg: No edema.     Left lower leg: No edema.  Skin:    General: Skin is warm and dry.     Capillary Refill: Capillary refill takes less than 2 seconds.     Coloration: Skin is not  jaundiced or pale.     Findings: No bruising, erythema or lesion.  Neurological:     Mental Status: He is alert and oriented to person, place, and time.     Motor: No weakness.     Coordination: Coordination normal.     Gait: Gait normal.  Psychiatric:        Mood and Affect: Mood normal.        Behavior: Behavior normal.        Thought Content: Thought content normal.        Judgment: Judgment normal.     BP 125/89   Pulse 62   Temp (!) 97.1 F (36.2 C)   Wt 224 lb 9.6 oz (101.9 kg)   SpO2 100%   BMI 33.17 kg/m  Wt Readings from Last 3 Encounters:  02/01/23 224  lb 9.6 oz (101.9 kg)  01/26/23 223 lb 12.3 oz (101.5 kg)  12/03/22 221 lb 3.2 oz (100.3 kg)    Lab Results  Component Value Date   TSH 2.090 12/06/2022   Lab Results  Component Value Date   WBC 7.8 12/06/2022   HGB 15.0 12/06/2022   HCT 46.0 12/06/2022   MCV 86 12/06/2022   PLT 146 (L) 12/06/2022   Lab Results  Component Value Date   NA 139 12/06/2022   K 4.1 12/06/2022   CO2 21 12/06/2022   GLUCOSE 124 (H) 12/06/2022   BUN 15 12/06/2022   CREATININE 1.01 12/06/2022   BILITOT <0.2 12/06/2022   ALKPHOS 118 12/06/2022   AST 32 12/06/2022   ALT 46 (H) 12/06/2022   PROT 6.4 12/06/2022   ALBUMIN 4.1 12/06/2022   CALCIUM 8.7 12/06/2022   EGFR 92 12/06/2022   Lab Results  Component Value Date   CHOL 208 (H) 12/06/2022   Lab Results  Component Value Date   HDL 29 (L) 12/06/2022   Lab Results  Component Value Date   LDLCALC 113 (H) 12/06/2022   Lab Results  Component Value Date   TRIG 378 (H) 12/06/2022   Lab Results  Component Value Date   CHOLHDL 7.2 (H) 12/06/2022   Lab Results  Component Value Date   HGBA1C 6.6 (A) 12/03/2022      Assessment & Plan:   Problem List Items Addressed This Visit       Respiratory   Chronic obstructive pulmonary disease (HCC)    Currently on albuterol inhaler as needed Pulmonology started the patient on Wixela and Spiriva discontinued but he has  not picked up the prescription Patient denies shortness of breath wheezing today Was encouraged to pick up the new prescription and take as ordered Spiriva Encouraged to follow-up with pulmonology for lung function test as discussed Smoking cessation encouraged  fluticasone-salmeterol (WIXELA INHUB) 250-50 MCG/ACT AEPB, 1 puff twice daily          Endocrine   Type 2 diabetes mellitus with hyperglycemia, without long-term current use of insulin (HCC) - Primary    Patient wants to make lifestyle changes for now Encouraged to avoid sugar sweets soda, lose weight Diabetic foot exam completed Rechecking lipid panel today .  We discussed starting a statin if his lipid panel remains elevated         Other   Tobacco use disorder    Smoking cessation encouraged Pharmacotherapy for smoking cessation discussed Not interested in medications for now       Hoarseness of voice    Has upcoming appointment with ENT this month      Hyperlipidemia LDL goal <70    Lab Results  Component Value Date   CHOL 208 (H) 12/06/2022   HDL 29 (L) 12/06/2022   LDLCALC 113 (H) 12/06/2022   TRIG 378 (H) 12/06/2022   CHOLHDL 7.2 (H) 12/06/2022   Has not made any changes to his diet  Checking lipid panel today, will start patient on a statin if his LDL and triglyceride remains elevated       Relevant Orders   Lipid panel   Elevated liver enzymes    Lab Results  Component Value Date   ALT 46 (H) 12/06/2022   AST 32 12/06/2022   ALKPHOS 118 12/06/2022   BILITOT <0.2 12/06/2022  Does not drink alcohol, no abdominal pain Patient encouraged to lose weight       No orders of the defined types  were placed in this encounter.   Follow-up: Return in about 3 months (around 05/03/2023).    Donell Beers, FNP

## 2023-02-01 NOTE — Assessment & Plan Note (Signed)
Smoking cessation encouraged Pharmacotherapy for smoking cessation discussed Not interested in medications for now

## 2023-02-01 NOTE — Assessment & Plan Note (Signed)
Has upcoming appointment with ENT this month

## 2023-02-01 NOTE — Assessment & Plan Note (Addendum)
Currently on albuterol inhaler as needed Pulmonology started the patient on Wixela and Spiriva discontinued but he has not picked up the prescription Patient denies shortness of breath wheezing today Was encouraged to pick up the new prescription and take as ordered Spiriva Encouraged to follow-up with pulmonology for lung function test as discussed Smoking cessation encouraged  fluticasone-salmeterol (WIXELA INHUB) 250-50 MCG/ACT AEPB, 1 puff twice daily

## 2023-02-02 ENCOUNTER — Other Ambulatory Visit: Payer: Self-pay | Admitting: Nurse Practitioner

## 2023-02-02 DIAGNOSIS — E785 Hyperlipidemia, unspecified: Secondary | ICD-10-CM

## 2023-02-02 LAB — LIPID PANEL
Chol/HDL Ratio: 6.7 ratio — ABNORMAL HIGH (ref 0.0–5.0)
Cholesterol, Total: 214 mg/dL — ABNORMAL HIGH (ref 100–199)
HDL: 32 mg/dL — ABNORMAL LOW (ref >39)
LDL Chol Calc (NIH): 137 mg/dL — ABNORMAL HIGH (ref 0–99)
Triglycerides: 247 mg/dL — ABNORMAL HIGH (ref 0–149)
VLDL Cholesterol Cal: 45 mg/dL — ABNORMAL HIGH (ref 5–40)

## 2023-02-02 MED ORDER — ATORVASTATIN CALCIUM 20 MG PO TABS: 20.0000 mg | ORAL_TABLET | Freq: Every day | ORAL | 1 refills | Status: DC

## 2023-02-06 ENCOUNTER — Other Ambulatory Visit: Payer: Self-pay | Admitting: Nurse Practitioner

## 2023-02-06 DIAGNOSIS — J449 Chronic obstructive pulmonary disease, unspecified: Secondary | ICD-10-CM

## 2023-03-09 ENCOUNTER — Encounter (HOSPITAL_BASED_OUTPATIENT_CLINIC_OR_DEPARTMENT_OTHER): Payer: Self-pay | Admitting: Pulmonary Disease

## 2023-03-09 ENCOUNTER — Ambulatory Visit (INDEPENDENT_AMBULATORY_CARE_PROVIDER_SITE_OTHER): Payer: Self-pay | Admitting: Pulmonary Disease

## 2023-03-09 VITALS — BP 122/74 | HR 84 | Resp 16 | Ht 68.25 in | Wt 225.8 lb

## 2023-03-09 DIAGNOSIS — F17200 Nicotine dependence, unspecified, uncomplicated: Secondary | ICD-10-CM

## 2023-03-09 DIAGNOSIS — J4530 Mild persistent asthma, uncomplicated: Secondary | ICD-10-CM

## 2023-03-09 DIAGNOSIS — J411 Mucopurulent chronic bronchitis: Secondary | ICD-10-CM

## 2023-03-09 LAB — PULMONARY FUNCTION TEST
DL/VA % pred: 130 %
DL/VA: 5.9 ml/min/mmHg/L
DLCO cor % pred: 120 %
DLCO cor: 34.11 ml/min/mmHg
DLCO unc % pred: 120 %
DLCO unc: 34.11 ml/min/mmHg
FEF 25-75 Post: 4.32 L/s
FEF 25-75 Pre: 4.25 L/s
FEF2575-%Change-Post: 1 %
FEF2575-%Pred-Post: 124 %
FEF2575-%Pred-Pre: 122 %
FEV1-%Change-Post: 0 %
FEV1-%Pred-Post: 96 %
FEV1-%Pred-Pre: 95 %
FEV1-Post: 3.67 L
FEV1-Pre: 3.65 L
FEV1FVC-%Change-Post: 1 %
FEV1FVC-%Pred-Pre: 107 %
FEV6-%Change-Post: 0 %
FEV6-%Pred-Post: 91 %
FEV6-%Pred-Pre: 91 %
FEV6-Post: 4.29 L
FEV6-Pre: 4.31 L
FEV6FVC-%Pred-Post: 103 %
FEV6FVC-%Pred-Pre: 103 %
FVC-%Change-Post: 0 %
FVC-%Pred-Post: 88 %
FVC-%Pred-Pre: 89 %
FVC-Post: 4.29 L
FVC-Pre: 4.31 L
Post FEV1/FVC ratio: 86 %
Post FEV6/FVC ratio: 100 %
Pre FEV1/FVC ratio: 85 %
Pre FEV6/FVC Ratio: 100 %
RV % pred: 102 %
RV: 1.93 L
TLC % pred: 98 %
TLC: 6.51 L

## 2023-03-09 MED ORDER — BUPROPION HCL ER (SR) 150 MG PO TB12: 150.0000 mg | ORAL_TABLET | Freq: Two times a day (BID) | ORAL | 2 refills | Status: DC

## 2023-03-09 NOTE — Progress Notes (Signed)
Full PFT Performed Today  

## 2023-03-09 NOTE — Patient Instructions (Signed)
Full PFT Performed Today  

## 2023-03-09 NOTE — Patient Instructions (Signed)
Mild persistent asthma Asthmatic bronchitis --CONTINUE Wixela-50 mcg ONE puff in the morning and evening. Printed script given --CONTINUE Albuterol TWO puffs AS NEEDED for shortness of breath and wheezing --Reviewed PFTs. Elevated of DLCO suggestive of asthma --Recommend treatment with prednisone taper if symptoms persistent for 48-72 hours  Tobacco abuse Patient is an active smoker. We discussed smoking cessation for 5 minutes. We discussed triggers and stressors and ways to deal with them. We discussed barriers to continued smoking and benefits of smoking cessation. Provided patient with information cessation techniques and interventions including [place] quitline. --Due to his low nicotine use. Advised avoiding nicotine patches

## 2023-03-09 NOTE — Progress Notes (Unsigned)
Subjective:   PATIENT ID: Matthew Hoover GENDER: male DOB: [birth year], MRN: [id number]  No chief complaint on file.   Reason for Visit: Follow-up possible COPD  [name] is a 47 year old active smoker with COPD, DM2, GERD who presents for follow-up for possible COPD.  Initial consult He recently established care with PCP, NP [facility]. Seen for ED follow-up for COPD exacerbation on 10/12/22. Started on Spiriva and albuterol. Referred to Pulmonary.  He reports chronic cough x 2-3 months. He reports respiratory infection/chronic bronchitis with productive cough, wheezing and shortness of breath 2-3 times a years. 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 Sickness can last up to month and recovers much slower compared to others. Will have lingering productive cough. He took the Spiriva for two months but unsure if it improved.   Denies history of childhood asthma but with activity wife can hear audible wheezing. It occurs at night. Wife reports snoring.   03/09/23 Since our last visit he is using Wixela 250-50 mcg. His cough is somewhat improved. Wife reports wheezing has improved. He is still smoking 3-5 cigarettes daily.  Social History: Smoked 20 years. Smokes 4-5 cigarettes. Does not vape  Environmental exposures: Painting, insulation, drywall dust, chickens x 4 months  Past Medical History:  Diagnosis Date   COPD (chronic obstructive pulmonary disease) (HCC)    Diabetes mellitus without complication (HCC)    GERD (gastroesophageal reflux disease)    Hyperlipidemia    Tobacco use disorder      Family History  Problem Relation Age of Onset   Diabetes Brother    Stroke Neg Hx    Heart attack Neg Hx      Social History   Occupational History   Not on file  Tobacco Use   Smoking status: Every Day    Current packs/day: 0.00    Types: Cigarettes    Last attempt to quit: 04/15/2016    Years since quitting:  6.9   Smokeless tobacco: Never  Substance and Sexual Activity   Alcohol use: No    Alcohol/week: 0.0 standard drinks of alcohol   Drug use: No   Sexual activity: Not on file    No Known Allergies   Outpatient Medications Prior to Visit  Medication Sig Dispense Refill   fluticasone-salmeterol (WIXELA INHUB) 250-50 MCG/ACT AEPB Inhale 1 puff into the lungs in the morning and at bedtime. 60 each 5   albuterol (VENTOLIN HFA) 108 (90 Base) MCG/ACT inhaler INHALE 1 TO 2 PUFFS INTO THE LUNGS EVERY 6 HOURS AS NEEDED FOR WHEEZING OR SHORTNESS OF BREATH (Patient not taking: Reported on 03/09/2023) 18 g 1   atorvastatin (LIPITOR) 20 MG tablet Take 1 tablet (20 mg total) by mouth daily. (Patient not taking: Reported on 03/09/2023) 90 tablet 1   No facility-administered medications prior to visit.    Review of Systems  Constitutional:  Negative for chills, diaphoresis, fever, malaise/fatigue and weight loss.  HENT:  Negative for congestion.   Respiratory:  Positive for cough, sputum production, shortness of breath and wheezing. Negative for hemoptysis.   Cardiovascular:  Negative for chest pain, palpitations and leg swelling.     Objective:   Vitals:   03/09/23 1440  BP: 122/74  Pulse: 84  Resp: 16  SpO2: 94%  Weight: 225 lb 12.8 oz (102.4 kg)  Height: 5' 8.25" (1.734 m)   SpO2: 94 %  Physical Exam: General: Well-appearing,  no acute distress HENT: [place], AT Eyes: EOMI, no scleral icterus Respiratory: Clear to auscultation bilaterally.  No crackles, wheezing or rales Cardiovascular: RRR, -M/R/G, no JVD Extremities:-Edema,-tenderness Neuro: AAO x4, CNII-XII grossly intact Psych: Normal mood, normal affect   Data Reviewed:  Imaging: CXR 10/12/22 - Coarse interstitial changes. No acute infiltrate effusion or edema  PFT: 03/09/23 FVC 4.29 (88%) FEV1 3.67 (96%) Ratio 86  TLC 98% DLCO 120% Interpretation: Normal PFTs. No significant bronchodilator response   Labs: CBC     Component Value Date/Time   WBC 7.8 12/06/2022 0911   RBC 5.36 12/06/2022 0911   HGB 15.0 12/06/2022 0911   HCT 46.0 12/06/2022 0911   PLT 146 (L) 12/06/2022 0911   MCV 86 12/06/2022 0911   MCH 28.0 12/06/2022 0911   MCHC 32.6 12/06/2022 0911   RDW 14.3 12/06/2022 0911   LYMPHSABS 2.6 12/06/2022 0911   EOSABS 0.1 12/06/2022 0911   BASOSABS 0.1 12/06/2022 0911   Absolute eos 12/06/22 - 100     Assessment & Plan:   Discussion: 47 year old active smoker with COPD, DM2, GERD who presents for follow-up PFTs reviewed and suggestive of asthma. Discussed clinical course and management of asthma including smoking cessation bronchodilator regimen, preventive care including vaccinations and action plan for exacerbation.  No insurance so provided resources like goodrx.com for [employer]. Will consider sleep evaluation in the future but would need insurance/financial aid assistance first.   Mild persistent asthma Asthmatic bronchitis --CONTINUE Wixela-50 mcg ONE puff in the morning and evening. Printed script given --CONTINUE Albuterol TWO puffs AS NEEDED for shortness of breath and wheezing --Reviewed PFTs. Elevated of DLCO suggestive of asthma --Recommend treatment with prednisone taper if symptoms persistent for 48-72 hours  Tobacco abuse Patient is an active smoker. We discussed smoking cessation for 5 minutes. We discussed triggers and stressors and ways to deal with them. We discussed barriers to continued smoking and benefits of smoking cessation. Provided patient with information cessation techniques and interventions including [place] quitline. --Due to his low nicotine use. Advised avoiding nicotine patches --Order wellbutrin 150 mg twice a day x 3 months. Advised to cut down on smoking after taking for 1 week  Reflux --Minimize spicy foods   Health Maintenance Immunization History  Administered Date(s) Administered   PFIZER(Purple Top)SARS-COV-2 Vaccination 10/20/2019, 11/12/2019    Tdap 08/02/2021   CT Lung Screen- not qualified  No orders of the defined types were placed in this encounter.  No orders of the defined types were placed in this encounter.   Return in about 4 months (around 07/10/2023).  I have spent a total time of 30-minutes on the day of the appointment including chart review, data review, collecting history, coordinating care and discussing medical diagnosis and plan with the patient/family. Past medical history, allergies, medications were reviewed. Pertinent imaging, labs and tests included in this note have been reviewed and interpreted independently by me.  Esli Clements Mechele Collin, MD [facility] 03/09/2023 3:08 PM  Office Number [phone]

## 2023-04-05 ENCOUNTER — Ambulatory Visit: Payer: Self-pay | Admitting: Nurse Practitioner

## 2023-05-03 ENCOUNTER — Ambulatory Visit: Payer: Self-pay | Admitting: Nurse Practitioner

## 2023-06-06 ENCOUNTER — Other Ambulatory Visit (HOSPITAL_BASED_OUTPATIENT_CLINIC_OR_DEPARTMENT_OTHER): Payer: Self-pay | Admitting: Pulmonary Disease

## 2023-07-28 ENCOUNTER — Other Ambulatory Visit: Payer: Self-pay | Admitting: Nurse Practitioner

## 2023-07-28 DIAGNOSIS — E785 Hyperlipidemia, unspecified: Secondary | ICD-10-CM

## 2023-08-07 ENCOUNTER — Other Ambulatory Visit (HOSPITAL_BASED_OUTPATIENT_CLINIC_OR_DEPARTMENT_OTHER): Payer: Self-pay | Admitting: Pulmonary Disease

## 2023-09-08 ENCOUNTER — Other Ambulatory Visit (HOSPITAL_BASED_OUTPATIENT_CLINIC_OR_DEPARTMENT_OTHER): Payer: Self-pay | Admitting: Pulmonary Disease

## 2023-12-16 ENCOUNTER — Other Ambulatory Visit (HOSPITAL_BASED_OUTPATIENT_CLINIC_OR_DEPARTMENT_OTHER): Payer: Self-pay | Admitting: Pulmonary Disease

## 2024-01-30 ENCOUNTER — Other Ambulatory Visit: Payer: Self-pay | Admitting: Nurse Practitioner

## 2024-01-30 DIAGNOSIS — E785 Hyperlipidemia, unspecified: Secondary | ICD-10-CM

## 2024-03-28 ENCOUNTER — Other Ambulatory Visit (HOSPITAL_BASED_OUTPATIENT_CLINIC_OR_DEPARTMENT_OTHER): Payer: Self-pay | Admitting: Pulmonary Disease

## 2024-04-10 ENCOUNTER — Other Ambulatory Visit: Payer: Self-pay | Admitting: Nurse Practitioner

## 2024-04-10 DIAGNOSIS — E785 Hyperlipidemia, unspecified: Secondary | ICD-10-CM

## 2024-04-10 NOTE — Telephone Encounter (Signed)
 atorvastatin  (LIPITOR) 20 MG tablet [[id number]]

## 2024-07-06 ENCOUNTER — Other Ambulatory Visit (HOSPITAL_BASED_OUTPATIENT_CLINIC_OR_DEPARTMENT_OTHER): Payer: Self-pay | Admitting: Pulmonary Disease
